# Patient Record
Sex: Male | Born: 1935 | Race: White | Hispanic: No | Marital: Married | Smoking: Never smoker
Health system: Southern US, Community
[De-identification: ages and names within clinical notes are randomized; demographics above are authoritative.]

## PROBLEM LIST (undated history)

## (undated) DIAGNOSIS — I2510 Atherosclerotic heart disease of native coronary artery without angina pectoris: Secondary | ICD-10-CM

## (undated) DIAGNOSIS — E119 Type 2 diabetes mellitus without complications: Secondary | ICD-10-CM

## (undated) DIAGNOSIS — I5020 Unspecified systolic (congestive) heart failure: Secondary | ICD-10-CM

## (undated) DIAGNOSIS — I10 Essential (primary) hypertension: Secondary | ICD-10-CM

---

## 2001-08-12 HISTORY — PX: VENTRICULOPERITONEAL SHUNT: SHX204

## 2007-08-13 HISTORY — PX: CORONARY ARTERY BYPASS GRAFT: SHX141

## 2013-02-22 ENCOUNTER — Encounter (HOSPITAL_COMMUNITY): Payer: Self-pay

## 2013-02-22 ENCOUNTER — Inpatient Hospital Stay (HOSPITAL_COMMUNITY): Payer: Medicare Other

## 2013-02-22 ENCOUNTER — Inpatient Hospital Stay (HOSPITAL_COMMUNITY)
Admission: EM | Admit: 2013-02-22 | Discharge: 2013-02-25 | DRG: 292 | Disposition: A | Discharge status: Home Health | Payer: Medicare Other | Attending: Cardiovascular Disease | Admitting: Cardiovascular Disease

## 2013-02-22 ENCOUNTER — Emergency Department (HOSPITAL_COMMUNITY): Payer: Medicare Other

## 2013-02-22 DIAGNOSIS — E1149 Type 2 diabetes mellitus with other diabetic neurological complication: Secondary | ICD-10-CM | POA: Diagnosis present

## 2013-02-22 DIAGNOSIS — E1169 Type 2 diabetes mellitus with other specified complication: Secondary | ICD-10-CM | POA: Diagnosis present

## 2013-02-22 DIAGNOSIS — Z9119 Patient's noncompliance with other medical treatment and regimen: Secondary | ICD-10-CM

## 2013-02-22 DIAGNOSIS — Z91199 Patient's noncompliance with other medical treatment and regimen due to unspecified reason: Secondary | ICD-10-CM

## 2013-02-22 DIAGNOSIS — E876 Hypokalemia: Secondary | ICD-10-CM | POA: Diagnosis present

## 2013-02-22 DIAGNOSIS — I10 Essential (primary) hypertension: Secondary | ICD-10-CM | POA: Diagnosis present

## 2013-02-22 DIAGNOSIS — L97409 Non-pressure chronic ulcer of unspecified heel and midfoot with unspecified severity: Secondary | ICD-10-CM | POA: Diagnosis present

## 2013-02-22 DIAGNOSIS — F329 Major depressive disorder, single episode, unspecified: Secondary | ICD-10-CM | POA: Diagnosis present

## 2013-02-22 DIAGNOSIS — F3289 Other specified depressive episodes: Secondary | ICD-10-CM | POA: Diagnosis present

## 2013-02-22 DIAGNOSIS — I4891 Unspecified atrial fibrillation: Secondary | ICD-10-CM | POA: Diagnosis present

## 2013-02-22 DIAGNOSIS — I498 Other specified cardiac arrhythmias: Secondary | ICD-10-CM | POA: Diagnosis present

## 2013-02-22 DIAGNOSIS — Z87891 Personal history of nicotine dependence: Secondary | ICD-10-CM

## 2013-02-22 DIAGNOSIS — I441 Atrioventricular block, second degree: Secondary | ICD-10-CM | POA: Diagnosis present

## 2013-02-22 DIAGNOSIS — I509 Heart failure, unspecified: Secondary | ICD-10-CM | POA: Diagnosis present

## 2013-02-22 DIAGNOSIS — Z951 Presence of aortocoronary bypass graft: Secondary | ICD-10-CM

## 2013-02-22 DIAGNOSIS — I4949 Other premature depolarization: Secondary | ICD-10-CM | POA: Diagnosis present

## 2013-02-22 DIAGNOSIS — Z95810 Presence of automatic (implantable) cardiac defibrillator: Secondary | ICD-10-CM

## 2013-02-22 DIAGNOSIS — E1142 Type 2 diabetes mellitus with diabetic polyneuropathy: Secondary | ICD-10-CM | POA: Diagnosis present

## 2013-02-22 DIAGNOSIS — I5023 Acute on chronic systolic (congestive) heart failure: Principal | ICD-10-CM | POA: Diagnosis present

## 2013-02-22 DIAGNOSIS — I428 Other cardiomyopathies: Secondary | ICD-10-CM | POA: Diagnosis present

## 2013-02-22 DIAGNOSIS — I2510 Atherosclerotic heart disease of native coronary artery without angina pectoris: Secondary | ICD-10-CM | POA: Diagnosis present

## 2013-02-22 HISTORY — DX: Type 2 diabetes mellitus without complications: E11.9

## 2013-02-22 HISTORY — DX: Atherosclerotic heart disease of native coronary artery without angina pectoris: I25.10

## 2013-02-22 HISTORY — DX: Essential (primary) hypertension: I10

## 2013-02-22 LAB — COMPREHENSIVE METABOLIC PANEL
Albumin: 3.6 g/dL (ref 3.5–5.2)
Alkaline Phosphatase: 73 U/L (ref 39–117)
BUN: 21 mg/dL (ref 6–23)
Creatinine, Ser: 1.2 mg/dL (ref 0.50–1.35)
GFR calc Af Amer: 65 mL/min — ABNORMAL LOW (ref >90)
GFR calc non Af Amer: 56 mL/min — ABNORMAL LOW (ref >90)
Potassium: 3.9 mEq/L (ref 3.5–5.1)
Total Bilirubin: 1.1 mg/dL (ref 0.3–1.2)
Total Protein: 7.3 g/dL (ref 6.0–8.3)

## 2013-02-22 LAB — CBC WITH DIFFERENTIAL/PLATELET
Basophils Relative: 0 % (ref 0–1)
Eosinophils Absolute: 0 10*3/uL (ref 0.0–0.7)
Hemoglobin: 16.2 g/dL (ref 13.0–17.0)
MCH: 30.5 pg (ref 26.0–34.0)
MCHC: 35.6 g/dL (ref 30.0–36.0)
Monocytes Absolute: 0.5 10*3/uL (ref 0.1–1.0)
Monocytes Relative: 5 % (ref 3–12)
Neutrophils Relative %: 86 % — ABNORMAL HIGH (ref 43–77)

## 2013-02-22 LAB — URINALYSIS, ROUTINE W REFLEX MICROSCOPIC
Bilirubin Urine: NEGATIVE
Glucose, UA: 100 mg/dL — AB
Ketones, ur: 15 mg/dL — AB
Leukocytes, UA: NEGATIVE
Protein, ur: >300 mg/dL — AB
pH: 6.5 (ref 5.0–8.0)

## 2013-02-22 LAB — URINE MICROSCOPIC-ADD ON

## 2013-02-22 MED ORDER — ASPIRIN EC 325 MG PO TBEC
325.0000 mg | DELAYED_RELEASE_TABLET | ORAL | Status: DC
  Administered 2013-02-22 – 2013-02-24 (×2): 325 mg via ORAL
  Filled 2013-02-22 (×4): qty 1

## 2013-02-22 MED ORDER — SODIUM CHLORIDE 0.9 % IV SOLN: 250.0000 mL | INTRAVENOUS | Status: DC | PRN

## 2013-02-22 MED ORDER — LISINOPRIL 20 MG PO TABS
20.0000 mg | ORAL_TABLET | Freq: Every day | ORAL | Status: DC
  Administered 2013-02-22 – 2013-02-24 (×3): 20 mg via ORAL
  Filled 2013-02-22 (×4): qty 1

## 2013-02-22 MED ORDER — ONDANSETRON HCL 4 MG/2ML IJ SOLN: 4.0000 mg | Freq: Four times a day (QID) | INTRAMUSCULAR | Status: DC | PRN

## 2013-02-22 MED ORDER — ADULT MULTIVITAMIN W/MINERALS CH
1.0000 | ORAL_TABLET | Freq: Every day | ORAL | Status: DC
  Administered 2013-02-22 – 2013-02-25 (×4): 1 via ORAL
  Filled 2013-02-22 (×4): qty 1

## 2013-02-22 MED ORDER — METFORMIN HCL 500 MG PO TABS
1000.0000 mg | ORAL_TABLET | Freq: Two times a day (BID) | ORAL | Status: DC
  Administered 2013-02-23 – 2013-02-24 (×3): 1000 mg via ORAL
  Filled 2013-02-22 (×5): qty 2

## 2013-02-22 MED ORDER — GLIPIZIDE 5 MG PO TABS
5.0000 mg | ORAL_TABLET | Freq: Two times a day (BID) | ORAL | Status: DC
  Administered 2013-02-23 – 2013-02-25 (×6): 5 mg via ORAL
  Filled 2013-02-22 (×7): qty 1

## 2013-02-22 MED ORDER — ACETAMINOPHEN 325 MG PO TABS: 650.0000 mg | ORAL_TABLET | ORAL | Status: DC | PRN

## 2013-02-22 MED ORDER — PAROXETINE HCL 10 MG PO TABS
10.0000 mg | ORAL_TABLET | Freq: Every day | ORAL | Status: DC
  Administered 2013-02-22 – 2013-02-24 (×3): 10 mg via ORAL
  Filled 2013-02-22 (×4): qty 1

## 2013-02-22 MED ORDER — SODIUM CHLORIDE 0.9 % IJ SOLN
3.0000 mL | Freq: Two times a day (BID) | INTRAMUSCULAR | Status: DC
  Administered 2013-02-22 – 2013-02-25 (×6): 3 mL via INTRAVENOUS

## 2013-02-22 MED ORDER — HEPARIN SODIUM (PORCINE) 5000 UNIT/ML IJ SOLN
5000.0000 [IU] | Freq: Three times a day (TID) | INTRAMUSCULAR | Status: DC
  Administered 2013-02-22 – 2013-02-25 (×7): 5000 [IU] via SUBCUTANEOUS
  Filled 2013-02-22 (×11): qty 1

## 2013-02-22 MED ORDER — POTASSIUM CHLORIDE CRYS ER 20 MEQ PO TBCR
20.0000 meq | EXTENDED_RELEASE_TABLET | Freq: Two times a day (BID) | ORAL | Status: DC
  Administered 2013-02-22: 20 meq via ORAL
  Filled 2013-02-22 (×3): qty 1

## 2013-02-22 MED ORDER — INSULIN ASPART 100 UNIT/ML ~~LOC~~ SOLN
0.0000 [IU] | Freq: Three times a day (TID) | SUBCUTANEOUS | Status: DC
  Administered 2013-02-23 (×2): 3 [IU] via SUBCUTANEOUS

## 2013-02-22 MED ORDER — FUROSEMIDE 10 MG/ML IJ SOLN
40.0000 mg | Freq: Two times a day (BID) | INTRAMUSCULAR | Status: DC
  Administered 2013-02-23 – 2013-02-24 (×3): 40 mg via INTRAVENOUS
  Filled 2013-02-22 (×5): qty 4

## 2013-02-22 MED ORDER — FUROSEMIDE 10 MG/ML IJ SOLN
80.0000 mg | Freq: Once | INTRAMUSCULAR | Status: AC
  Administered 2013-02-22: 80 mg via INTRAVENOUS
  Filled 2013-02-22: qty 8

## 2013-02-22 MED ORDER — SODIUM CHLORIDE 0.9 % IJ SOLN: 3.0000 mL | INTRAMUSCULAR | Status: DC | PRN

## 2013-02-22 NOTE — H&P (Signed)
Frederick Carr is an 79 y.o. male.   Chief Complaint: Weakness and leg edema HPI: 79 years old male who moved from [place], [place] 6 months ago, has been non-compliant on his medications and presents with leg edema and weakness. No chest pain, fever or cough. He has diabetic neuropathy and left leg weakness for some time.   Past Medical History  Diagnosis Date  . Diabetes mellitus without complication   . Hypertension   . Coronary artery disease   . CHF (congestive heart failure)     CABG-2009.   History reviewed. No pertinent past surgical history.  No family history on file. Social History:  reports that he has quit smoking. He does not have any smokeless tobacco history on file. He reports that he does not drink alcohol or use illicit drugs.  Allergies: No Known Allergies   (Not in a hospital admission)  Results for orders placed during the hospital encounter of 02/22/13 (from the past 48 hour(s))  CBC WITH DIFFERENTIAL     Status: Abnormal   Collection Time    02/22/13  3:34 PM      Result Value Range   WBC 9.8  4.0 - 10.5 K/uL   RBC 5.31  4.22 - 5.81 MIL/uL   Hemoglobin 16.2  13.0 - 17.0 g/dL   HCT 54.0  98.1 - 19.1 %   MCV 85.7  78.0 - 100.0 fL   MCH 30.5  26.0 - 34.0 pg   MCHC 35.6  30.0 - 36.0 g/dL   RDW 47.8  29.5 - 62.1 %   Platelets 176  150 - 400 K/uL   Neutrophils Relative % 86 (*) 43 - 77 %   Neutro Abs 8.4 (*) 1.7 - 7.7 K/uL   Lymphocytes Relative 9 (*) 12 - 46 %   Lymphs Abs 0.9  0.7 - 4.0 K/uL   Monocytes Relative 5  3 - 12 %   Monocytes Absolute 0.5  0.1 - 1.0 K/uL   Eosinophils Relative 0  0 - 5 %   Eosinophils Absolute 0.0  0.0 - 0.7 K/uL   Basophils Relative 0  0 - 1 %   Basophils Absolute 0.0  0.0 - 0.1 K/uL  COMPREHENSIVE METABOLIC PANEL     Status: Abnormal   Collection Time    02/22/13  3:34 PM      Result Value Range   Sodium 138  135 - 145 mEq/L   Potassium 3.9  3.5 - 5.1 mEq/L   Chloride 103  96 - 112 mEq/L   CO2 23  19 - 32 mEq/L   Glucose, Bld 176 (*) 70 - 99 mg/dL   BUN 21  6 - 23 mg/dL   Creatinine, Ser 3.08  0.50 - 1.35 mg/dL   Calcium 9.1  8.4 - 65.7 mg/dL   Total Protein 7.3  6.0 - 8.3 g/dL   Albumin 3.6  3.5 - 5.2 g/dL   AST 14  0 - 37 U/L   ALT 15  0 - 53 U/L   Alkaline Phosphatase 73  39 - 117 U/L   Total Bilirubin 1.1  0.3 - 1.2 mg/dL   GFR calc non Af Amer 56 (*) >90 mL/min   GFR calc Af Amer 65 (*) >90 mL/min   Comment:            The eGFR has been calculated     using the CKD EPI equation.     This calculation has not been  validated in all clinical     situations.     eGFR's persistently     <90 mL/min signify     possible Chronic Kidney Disease.  PRO B NATRIURETIC PEPTIDE     Status: Abnormal   Collection Time    02/22/13  3:35 PM      Result Value Range   Pro B Natriuretic peptide (BNP) 7023.0 (*) 0 - 450 pg/mL  TROPONIN I     Status: None   Collection Time    02/22/13  3:35 PM      Result Value Range   Troponin I <0.30  <0.30 ng/mL   Comment:            Due to the release kinetics of cTnI,     a negative result within the first hours     of the onset of symptoms does not rule out     myocardial infarction with certainty.     If myocardial infarction is still suspected,     repeat the test at appropriate intervals.  URINALYSIS, ROUTINE W REFLEX MICROSCOPIC     Status: Abnormal   Collection Time    02/22/13  4:08 PM      Result Value Range   Color, Urine YELLOW  YELLOW   APPearance CLEAR  CLEAR   Specific Gravity, Urine 1.020  1.005 - 1.030   pH 6.5  5.0 - 8.0   Glucose, UA 100 (*) NEGATIVE mg/dL   Hgb urine dipstick SMALL (*) NEGATIVE   Bilirubin Urine NEGATIVE  NEGATIVE   Ketones, ur 15 (*) NEGATIVE mg/dL   Protein, ur >161 (*) NEGATIVE mg/dL   Urobilinogen, UA 1.0  0.0 - 1.0 mg/dL   Nitrite NEGATIVE  NEGATIVE   Leukocytes, UA NEGATIVE  NEGATIVE  URINE MICROSCOPIC-ADD ON     Status: None   Collection Time    02/22/13  4:08 PM      Result Value Range   Squamous  Epithelial / LPF RARE  RARE   WBC, UA 0-2  <3 WBC/hpf   RBC / HPF 0-2  <3 RBC/hpf   Urine-Other MUCOUS PRESENT     Dg Chest 2 View  02/22/2013   *RADIOLOGY REPORT*  Clinical Data: Weakness.  History of cardiac bypass.  CHEST - 2 VIEW  Comparison: None.  Findings: Low volume chest.  Basilar atelectasis, greater on the right than left.  Left subclavian AICD.  Median sternotomy compatible with history of cardiac bypass.  No focal consolidation or pleural effusion is identified.  The cardiopericardial silhouette appears mildly enlarged however this may be secondary to the low inspiratory volumes.  IMPRESSION: Low volume chest.  Left subclavian AICD and postop changes of median sternotomy.  No definite acute cardiopulmonary disease.   Original Report Authenticated By: Andreas Newport, M.D.   Ct Head Wo Contrast  02/22/2013   *RADIOLOGY REPORT*  Clinical Data: Increased weakness  CT HEAD WITHOUT CONTRAST  Technique:  Contiguous axial images were obtained from the base of the skull through the vertex without contrast.  Comparison: None.  Findings: A VP shunt catheter enters the skull from a left frontal approach and traverses into the left lateral ventricle.  The tip is in the frontal horn. Ventricular system and extraaxial space are prominent compatible with diffuse atrophy.  There is no disproportionate dilatation of the ventricles.  There is no evidence of transependymal edema.  White matter atrophy is associated in the periventricular white matter.  There is no definitive evidence of  acute intracranial hemorrhage. The leptomeninges over the right temporal lobe is somewhat hyperdense however this is felt to be related to chronic change rather than acute hemorrhage.  Atherosclerotic calcifications of the internal carotid and middle cerebral artery are noted.  IMPRESSION: VP shunt catheter is in place with diffuse atrophy.  The ventricular system is prominent but there is no definite evidence of acute  hydrocephalus.  No evidence of acute intracranial pathology.  Chronic changes are noted.   Original Report Authenticated By: Jolaine Click, M.D.    @ROS @ ROS: [x]  Positive [ ]  Denies  General: [ ]  Weight loss, [ ]  Fever, [ ]  chills  Neurologic: [x ] Dizziness, [ ]  Blackouts, [ ]  Seizure  [ ]  Stroke, [ ]  "Mini stroke", [ ]  Slurred speech, [ ]  Temporary blindness; [x ] weakness in arms or legs, [ ]  Hoarseness  Cardiac: [x ] Chest pain/pressure, [ ]  Shortness of breath at rest [ ]  Shortness of breath with exertion, [ ]  Atrial fibrillation or irregular heartbeat  Vascular: [ ]  Pain in legs with walking, [ ]  Pain in legs at rest, [ ]  Pain in legs at night,  [ ]  Non-healing ulcer, [ ]  Blood clot in vein/DVT,  Pulmonary: [ ]  Home oxygen, [ ]  Productive cough, [ ]  Coughing up blood, [ ]  Asthma,  [ ]  Wheezing  Musculoskeletal: [ x] Arthritis, [x ] Low back pain, [ ]  Joint pain  Hematologic: [ ]  Easy Bruising, [ ]  Anemia; [ ]  Hepatitis  Gastrointestinal: [ ]  Blood in stool, [ ]  Gastroesophageal Reflux/heartburn, [ ]  Trouble swallowing  Urinary: [ ]  chronic Kidney disease, [ ]  on HD - [ ]  MWF or [ ]  TTHS, [ ]  Burning with urination, [ ]  Difficulty urinating  Skin: [ ]  Rashes, [x ] Wounds  Psychological: [ ]  Anxiety, [x ] Depression  Blood pressure 174/83, pulse 42, temperature 97.8 F (36.6 C), temperature source Oral, resp. rate 11, SpO2 95.00%.  HENT: Head: Normocephalic and atraumatic. Mouth/Throat: Oropharynx is clear and moist. Eyes: Hazel, conjunctivae and EOM are normal. Pupils are equal, round, and reactive to light.  Neck: Normal range of motion.  Cardiovascular: Normal rate, regular rhythm and normal heart sounds.  Pulmonary/Chest: Effort normal and breath sounds normal.  Abdominal: Soft. Bowel sounds are normal. There is mild epigastric tenderness. There is no guarding.  Neurological: She is alert and oriented to person, place, and time.  Skin: Skin is warm and dry.  Ext: 2 + edema. No  cyanosis or clubbing. Right heel diabetic ulcer. Psychiatric: He has a normal mood and flat affect.   Assessment/Plan Acute on chronic left heart systolic failure. Bradycardia with frequent premature ventricular beats DM, II with neuropathy Generalized weakness Depression CAD CABG Medications non-compliance  Admit/IV lasix./Right and left heart catheterization post diuresis/Echocardiogram  Millette Halberstam S 02/22/2013, 7:02 PM

## 2013-02-22 NOTE — ED Notes (Signed)
Pt. Woke up with increased weakness, became worse throughout the day.   Alert and oriented X4. STroke  Scale negative.   Denies any n/v Denies any chest pain or sob.  Denies any fevers.

## 2013-02-22 NOTE — ED Notes (Signed)
Dinner tray ordered.

## 2013-02-22 NOTE — ED Notes (Signed)
Cannot take vitals. Patient not in room

## 2013-02-22 NOTE — ED Provider Notes (Addendum)
History    CSN: [id number] Arrival date & time 02/22/13  1505  First MD Initiated Contact with Patient 02/22/13 1506     Chief Complaint  Patient presents with  . Weakness   (Consider location/radiation/quality/duration/timing/severity/associated sxs/prior Treatment) HPI Comments: Patient presents to the ER for evaluation of generalized weakness. Patient reports that he has been feeling very weak for some time, but in the last 24 hours symptoms have gotten much worse. Patient has not experienced any chest pain or shortness of breath. He denies headache and vision change. There has not been any cough, fever, nausea or vomiting. He does endorse intermittent diarrhea, mild. No abdominal pain associated. Patient has not had any urinary symptoms.  Patient reports that when he gets up and tries to walk, weakness significantly worsens. He gets very dizzy and has not dropped objects. He is unable to ambulate because of this.  Patient does report pain on the right heel. He is a diabetic, has been associating that with a diabetic neuropathy.  Patient is a 79 y.o. male presenting with weakness.  Weakness   No past medical history on file. No past surgical history on file. No family history on file. History  Substance Use Topics  . Smoking status: Not on file  . Smokeless tobacco: Not on file  . Alcohol Use: Not on file    Review of Systems  Gastrointestinal: Positive for diarrhea.  Neurological: Positive for weakness.  All other systems reviewed and are negative.    Allergies  Review of patient's allergies indicates not on file.  Home Medications  No current outpatient prescriptions on file. BP 137/85  Pulse 58  Temp(Src) 97.8 F (36.6 C) (Oral)  Resp 21  SpO2 94% Physical Exam  Constitutional: He is oriented to person, place, and time. He appears well-developed and well-nourished. No distress.  HENT:  Head: Normocephalic and atraumatic.  Right Ear: Hearing normal.  Left  Ear: Hearing normal.  Nose: Nose normal.  Mouth/Throat: Oropharynx is clear and moist and mucous membranes are normal.  Eyes: Conjunctivae and EOM are normal. Pupils are equal, round, and reactive to light.  Neck: Normal range of motion. Neck supple.  Cardiovascular: Regular rhythm, S1 normal and S2 normal.  Bradycardia present.  Exam reveals no gallop and no friction rub.   No murmur heard. Pulmonary/Chest: Effort normal. No respiratory distress. He has rales in the right lower field and the left lower field. He exhibits no tenderness.  Abdominal: Soft. Normal appearance and bowel sounds are normal. There is no hepatosplenomegaly. There is no tenderness. There is no rebound, no guarding, no tenderness at McBurney's point and negative Murphy's sign. No hernia.  Musculoskeletal: Normal range of motion. He exhibits edema.  Neurological: He is alert and oriented to person, place, and time. He has normal strength. No cranial nerve deficit or sensory deficit. Coordination normal. GCS eye subscore is 4. GCS verbal subscore is 5. GCS motor subscore is 6.  Skin: Skin is warm, dry and intact. No rash noted. No cyanosis.     Psychiatric: He has a normal mood and affect. His speech is normal and behavior is normal. Thought content normal.    ED Course  Procedures (including critical care time)  EKG:  Date: 02/22/2013  Rate: 57  Rhythm: sinus bradycardia  QRS Axis: normal  Intervals: PR prolonged  ST/T Wave abnormalities: ST elevations anteriorly  Conduction Disutrbances:nonspecific intraventricular conduction delay  Narrative Interpretation: st elevations associated with Q waves anteriorly  Old EKG Reviewed: none  available   Labs Reviewed  CBC WITH DIFFERENTIAL - Abnormal; Notable for the following:    Neutrophils Relative % 86 (*)    Neutro Abs 8.4 (*)    Lymphocytes Relative 9 (*)    All other components within normal limits  COMPREHENSIVE METABOLIC PANEL - Abnormal; Notable for the  following:    Glucose, Bld 176 (*)    GFR calc non Af Amer 56 (*)    GFR calc Af Amer 65 (*)    All other components within normal limits  PRO B NATRIURETIC PEPTIDE - Abnormal; Notable for the following:    Pro B Natriuretic peptide (BNP) 7023.0 (*)    All other components within normal limits  URINALYSIS, ROUTINE W REFLEX MICROSCOPIC - Abnormal; Notable for the following:    Glucose, UA 100 (*)    Hgb urine dipstick SMALL (*)    Ketones, ur 15 (*)    Protein, ur >300 (*)    All other components within normal limits  TROPONIN I  URINE MICROSCOPIC-ADD ON   Dg Chest 2 View  02/22/2013   *RADIOLOGY REPORT*  Clinical Data: Weakness.  History of cardiac bypass.  CHEST - 2 VIEW  Comparison: None.  Findings: Low volume chest.  Basilar atelectasis, greater on the right than left.  Left subclavian AICD.  Median sternotomy compatible with history of cardiac bypass.  No focal consolidation or pleural effusion is identified.  The cardiopericardial silhouette appears mildly enlarged however this may be secondary to the low inspiratory volumes.  IMPRESSION: Low volume chest.  Left subclavian AICD and postop changes of median sternotomy.  No definite acute cardiopulmonary disease.   Original Report Authenticated By: Andreas Newport, M.D.   Ct Head Wo Contrast  02/22/2013   *RADIOLOGY REPORT*  Clinical Data: Increased weakness  CT HEAD WITHOUT CONTRAST  Technique:  Contiguous axial images were obtained from the base of the skull through the vertex without contrast.  Comparison: None.  Findings: A VP shunt catheter enters the skull from a left frontal approach and traverses into the left lateral ventricle.  The tip is in the frontal horn. Ventricular system and extraaxial space are prominent compatible with diffuse atrophy.  There is no disproportionate dilatation of the ventricles.  There is no evidence of transependymal edema.  White matter atrophy is associated in the periventricular white matter.  There is no  definitive evidence of acute intracranial hemorrhage. The leptomeninges over the right temporal lobe is somewhat hyperdense however this is felt to be related to chronic change rather than acute hemorrhage.  Atherosclerotic calcifications of the internal carotid and middle cerebral artery are noted.  IMPRESSION: VP shunt catheter is in place with diffuse atrophy.  The ventricular system is prominent but there is no definite evidence of acute hydrocephalus.  No evidence of acute intracranial pathology.  Chronic changes are noted.   Original Report Authenticated By: Jolaine Click, M.D.   Diagnosis: 1. Congestive heart failure 2. Bradycardia 3. Generalized weakness  MDM  Patient presents to the ER for evaluation of generalized weakness. This has been unable to ambulate because of significant dizziness and weakness. He reports that he has been feeling badly for some time, but in the last 24 hours, symptoms have significantly worsened. Patient's wife now tells her that he has not taken the Lasix in 3 days because he does not want to keep getting up to go to the bathroom. Patient's oxygen saturations are somewhat diminished. The ER. He has not, however, in respiratory distress.  BNP is markedly elevated. I suspect decompensated congestive heart failure as a cause of some of the patient's symptoms.  Patient reports a previous history of atrial fibrillation. He is on sotalol. Patient is noted to be in sinus rhythm, but bradycardic. Additionally, patient having significant ectopy. Many of his PVCs are not being perfused, pulse rate is effectively in the 30s and 40s and his PVCs are not perfused. This is also likely adding to the patient's generalized weakness with exertion.  This patient decompensated congestive heart failure and bradycardia, his cardiologist will be consulted to evaluate and admit the patient.   Gilda Crease, MD 02/22/13 1746  Gilda Crease, MD 02/22/13 1747

## 2013-02-22 NOTE — ED Notes (Signed)
ADmitting MD at the bedside

## 2013-02-23 LAB — BASIC METABOLIC PANEL
BUN: 22 mg/dL (ref 6–23)
Calcium: 9.5 mg/dL (ref 8.4–10.5)
GFR calc Af Amer: 51 mL/min — ABNORMAL LOW (ref >90)
GFR calc non Af Amer: 44 mL/min — ABNORMAL LOW (ref >90)
Glucose, Bld: 125 mg/dL — ABNORMAL HIGH (ref 70–99)
Potassium: 3.3 mEq/L — ABNORMAL LOW (ref 3.5–5.1)
Sodium: 139 mEq/L (ref 135–145)

## 2013-02-23 LAB — GLUCOSE, CAPILLARY
Glucose-Capillary: 144 mg/dL — ABNORMAL HIGH (ref 70–99)
Glucose-Capillary: 96 mg/dL (ref 70–99)

## 2013-02-23 LAB — HEMOGLOBIN A1C: Mean Plasma Glucose: 134 mg/dL — ABNORMAL HIGH (ref <117)

## 2013-02-23 MED ORDER — POTASSIUM CHLORIDE CRYS ER 20 MEQ PO TBCR
40.0000 meq | EXTENDED_RELEASE_TABLET | Freq: Three times a day (TID) | ORAL | Status: DC
  Administered 2013-02-23 (×3): 40 meq via ORAL
  Filled 2013-02-23 (×5): qty 2

## 2013-02-23 MED ORDER — POTASSIUM CHLORIDE CRYS ER 20 MEQ PO TBCR: 40.0000 meq | EXTENDED_RELEASE_TABLET | Freq: Three times a day (TID) | ORAL | Status: DC

## 2013-02-23 MED ORDER — PNEUMOCOCCAL VAC POLYVALENT 25 MCG/0.5ML IJ INJ
0.5000 mL | INJECTION | INTRAMUSCULAR | Status: AC
  Filled 2013-02-23: qty 0.5

## 2013-02-23 NOTE — Progress Notes (Signed)
Nutrition Brief Note  Patient identified on the Malnutrition Screening Tool (MST) Report. Pt denies poor appetite. Per pt, usual weight is 215 lb. Suspect recent wt change likely fluid related.  RD provided "Low Sodium Nutrition Therapy" handout from the Academy of Nutrition and Dietetics. Reviewed patient's dietary recall. Provided examples on ways to decrease sodium intake in diet. Discouraged intake of processed foods and use of salt shaker. Encouraged fresh fruits and vegetables as well as whole grain sources of carbohydrates to maximize fiber intake.   RD discussed why it is important for patient to adhere to diet recommendations, and emphasized the role of fluids, foods to avoid, and importance of weighing self daily. Teach back method used.  Expect fair compliance. Noted pt stated that his wife is a Data processing manager and knows all of this information.  Body mass index is 32.12 kg/(m^2). Patient meets criteria for Obese Class I based on current BMI.   Current diet order is Carbohydrate Modified Medium Labs and medications reviewed.   No nutrition interventions warranted at this time. If nutrition issues arise, please consult RD.   Jarold Motto MS, RD, LDN Pager: [phone] After-hours pager: [phone]

## 2013-02-23 NOTE — Progress Notes (Signed)
Pt HR goes down to upper 30's, then goes back up to high 50's and low 60's. Pt asymptomatic when assessed no c/o chest pains nor SOB. Dr Algie Coffer notified . No new orders received. Continued to monitor patient closely

## 2013-02-23 NOTE — Progress Notes (Signed)
Subjective:  Feeling better. No chest pain. Decreasing leg edema. Poor LV systolic function by echocardiogram.  Objective:  Vital Signs in the last 24 hours: Temp:  [97.1 F (36.2 C)-98.3 F (36.8 C)] 97.1 F (36.2 C) (07/15 1807) Pulse Rate:  [57-72] 72 (07/15 1807) Cardiac Rhythm:  [-] Heart block;Sinus bradycardia (07/15 0830) Resp:  [18-26] 18 (07/15 1807) BP: (115-148)/(56-80) 123/63 mmHg (07/15 1807) SpO2:  [93 %-99 %] 99 % (07/15 1807) Weight:  [93.033 kg (205 lb 1.6 oz)] 93.033 kg (205 lb 1.6 oz) (07/14 2150)  Physical Exam: BP Readings from Last 1 Encounters:  02/23/13 123/63     Wt Readings from Last 1 Encounters:  02/22/13 93.033 kg (205 lb 1.6 oz)    Weight change:   HEENT: [place]/AT, Eyes- PERL, EOMI, Conjunctiva-Pink, Sclera-Non-icteric Neck: No JVD, No bruit, Trachea midline. Lungs:  Clearing, Bilateral. Cardiac:  Regular rhythm, normal S1 and S2, no S3.  Abdomen:  Soft, non-tender. Extremities:  1+ edema present. No cyanosis. No clubbing. Right heel ulcer. CNS: AxOx3, Cranial nerves grossly intact, moves all 4 extremities. Right handed. Skin: Warm and dry.   Intake/Output from previous day: 07/14 0701 - 07/15 0700 In: 360 [P.O.:360] Out: 1400 [Urine:1400]    Lab Results: BMET    Component Value Date/Time   NA 139 02/23/2013 0608   K 3.3* 02/23/2013 0608   CL 101 02/23/2013 0608   CO2 25 02/23/2013 0608   GLUCOSE 125* 02/23/2013 0608   BUN 22 02/23/2013 0608   CREATININE 1.47* 02/23/2013 0608   CALCIUM 9.5 02/23/2013 0608   GFRNONAA 44* 02/23/2013 0608   GFRAA 51* 02/23/2013 0608   CBC    Component Value Date/Time   WBC 9.8 02/22/2013 1534   RBC 5.31 02/22/2013 1534   HGB 16.2 02/22/2013 1534   HCT 45.5 02/22/2013 1534   PLT 176 02/22/2013 1534   MCV 85.7 02/22/2013 1534   MCH 30.5 02/22/2013 1534   MCHC 35.6 02/22/2013 1534   RDW 13.9 02/22/2013 1534   LYMPHSABS 0.9 02/22/2013 1534   MONOABS 0.5 02/22/2013 1534   EOSABS 0.0 02/22/2013 1534   BASOSABS 0.0  02/22/2013 1534   CARDIAC ENZYMES Lab Results  Component Value Date   TROPONINI <0.30 02/22/2013    Scheduled Meds: . aspirin EC  325 mg Oral 3 times weekly  . furosemide  40 mg Intravenous Q12H  . glipiZIDE  5 mg Oral BID AC  . heparin  5,000 Units Subcutaneous Q8H  . insulin aspart  0-20 Units Subcutaneous TID WC  . lisinopril  20 mg Oral QHS  . metFORMIN  1,000 mg Oral BID WC  . multivitamin with minerals  1 tablet Oral Daily  . PARoxetine  10 mg Oral QHS  . pneumococcal 23 valent vaccine  0.5 mL Intramuscular Tomorrow-1000  . potassium chloride  40 mEq Oral TID  . sodium chloride  3 mL Intravenous Q12H   Continuous Infusions:  PRN Meds:.sodium chloride, acetaminophen, ondansetron (ZOFRAN) IV, sodium chloride  Assessment/Plan: Acute on chronic left heart systolic failure.  Bradycardia with frequent premature ventricular beats  DM, II with neuropathy  Generalized weakness  Depression  CAD  CABG  Medications non-compliance  Continue diuresis. PT/OT consult   LOS: 1 day    Orpah Cobb  MD  02/23/2013, 7:50 PM

## 2013-02-23 NOTE — Progress Notes (Signed)
Utilization Review Completed Dyneisha Murchison J. Domnique Vanegas, RN, BSN, NCM [phone]  

## 2013-02-23 NOTE — Care Management Note (Addendum)
    Page 1 of 2   03/01/2013     4:24:35 PM   CARE MANAGEMENT NOTE 03/01/2013  Patient:  Hillmer,Jafar   Account Number:  [id number]  Date Initiated:  02/23/2013  Documentation initiated by:  Oletta Cohn  Subjective/Objective Assessment:   79 yo male admitted with weakness, leg edema and CHF// home with spouse (moved from [place], [place] 71mo ago)     Action/Plan:   IV lasix.;Right and left heart catheterization post diuresis;Echocardiogram//home with [facility]   Anticipated DC Date:  02/26/2013   Anticipated DC Plan:  HOME W HOME HEALTH SERVICES      DC Planning Services  CM consult      [facility] Choice  HOME HEALTH   Choice offered to / List presented to:  C-1 Patient        HH arranged  HH-1 RN  HH-10 DISEASE MANAGEMENT  HH-2 PT      HH agency  [facility]   Status of service:  Completed, signed off Medicare Important Message given?   (If response is "NO", the following Medicare IM given date fields will be blank) Date Medicare IM given:   Date Additional Medicare IM given:    Discharge Disposition:    Per UR Regulation:  Reviewed for med. necessity/level of care/duration of stay  If discussed at Long Length of Stay Meetings, dates discussed:    Comments:  02/25/13 @ 1345.Marland KitchenMarland KitchenOletta Cohn, RN, BSN, [place] [phone] Spoke to pt at bedside regarding Home Health discharge planning for disease mangement of CHF and Physical Therapy. Offered pt list of Home Health agencies.  Pt chose [facility] to render services.  Tommas Olp of [place] notified.  Pt uses a cane at home- no other DME needs identified at this time.

## 2013-02-23 NOTE — Progress Notes (Signed)
  Echocardiogram 2D Echocardiogram has been performed.  Jorje Guild 02/23/2013, 9:49 AM

## 2013-02-23 NOTE — Progress Notes (Signed)
Admitted pt from ED. AAox3. Assisted to bed. Oriented to call bell and room. 02 put on pt. VS taken and recorded. SR up x3. Pt incontinent of urine . Condom cath applied by tech. Telemetry =1st degree block. No c/o chest pains nor SOB. Instructed to call for assistance.Initial meds given as ordered. Blood glucose obtained. Continued to monitor patient

## 2013-02-24 LAB — GLUCOSE, CAPILLARY
Glucose-Capillary: 89 mg/dL (ref 70–99)
Glucose-Capillary: 82 mg/dL (ref 70–99)

## 2013-02-24 LAB — BASIC METABOLIC PANEL
Calcium: 9.6 mg/dL (ref 8.4–10.5)
GFR calc Af Amer: 44 mL/min — ABNORMAL LOW (ref >90)
GFR calc non Af Amer: 38 mL/min — ABNORMAL LOW (ref >90)
Potassium: 4.6 mEq/L (ref 3.5–5.1)
Sodium: 139 mEq/L (ref 135–145)

## 2013-02-24 MED ORDER — POTASSIUM CHLORIDE CRYS ER 10 MEQ PO TBCR
10.0000 meq | EXTENDED_RELEASE_TABLET | Freq: Every day | ORAL | Status: DC
  Administered 2013-02-24 – 2013-02-25 (×2): 10 meq via ORAL
  Filled 2013-02-24 (×2): qty 1

## 2013-02-24 MED ORDER — FUROSEMIDE 40 MG PO TABS
40.0000 mg | ORAL_TABLET | Freq: Every day | ORAL | Status: DC
  Administered 2013-02-25: 40 mg via ORAL
  Filled 2013-02-24: qty 1

## 2013-02-24 NOTE — Evaluation (Signed)
Physical Therapy Evaluation Patient Details Name: Frederick Carr MRN: [id number] DOB: 1932/03/16 Today's Date: 02/24/2013 Time: 7829-5621 PT Time Calculation (min): 17 min  PT Assessment / Plan / Recommendation History of Present Illness     Clinical Impression  Pt is a 79 years old male who moved from [place], [place] 6 months ago, has been non-compliant on his medications and presents with leg edema and weakness, diagnosed with CHF and hx of diabetic neuropathy and left leg weakness for some time.  Pt currently with functional limitations due to the deficits listed below (see PT Problem List).  Pt will benefit from skilled PT to increase their independence and safety with mobility to allow discharge to the venue listed below.  Pt reports hx of poor balance and agreeable to use RW at home upon d/c. Recommend RW upon d/c.     PT Assessment  Patient needs continued PT services    Follow Up Recommendations  Home health PT;Supervision for mobility/OOB    Does the patient have the potential to tolerate intense rehabilitation      Barriers to Discharge        Equipment Recommendations  Rolling walker with 5" wheels    Recommendations for Other Services     Frequency Min 3X/week    Precautions / Restrictions Precautions Precautions: Fall   Pertinent Vitals/Pain Post-gait: SaO2 97% and HR 62 bpm      Mobility  Bed Mobility Bed Mobility: Not assessed Details for Bed Mobility Assistance: up in recliner on arrival Transfers Transfers: Sit to Stand;Stand to Sit Sit to Stand: 5: Supervision;From chair/3-in-1;With upper extremity assist Stand to Sit: 5: Supervision;To chair/3-in-1;With upper extremity assist Details for Transfer Assistance: verbal cues for safety  Ambulation/Gait Ambulation/Gait Assistance: 4: Min guard Ambulation Distance (Feet): 280 Feet Assistive device: Rolling walker Ambulation/Gait Assistance Details: ambulated to doorway with 1 hha and using  sink/chair/surfaces so given RW to steady with hallway ambulation, pt reports feeling very weak Gait Pattern: Step-through pattern;Decreased stride length Gait velocity: decreased General Gait Details: antalgic gait observed however pt reports no pain, R foot appears smaller than L     Exercises     PT Diagnosis: Difficulty walking;Generalized weakness  PT Problem List: Decreased strength;Decreased mobility;Decreased balance;Decreased knowledge of use of DME PT Treatment Interventions: DME instruction;Gait training;Therapeutic activities;Therapeutic exercise;Patient/family education;Functional mobility training;Balance training;Neuromuscular re-education     PT Goals(Current goals can be found in the care plan section) Acute Rehab PT Goals Patient Stated Goal: home tomorrow PT Goal Formulation: With patient Time For Goal Achievement: 03/03/13 Potential to Achieve Goals: Good  Visit Information  Last PT Received On: 02/24/13 Assistance Needed: +1       Prior Functioning  Home Living Family/patient expects to be discharged to:: Private residence Living Arrangements: Spouse/significant other Type of Home: Apartment Home Equipment: Cane - single point Additional Comments: Pt lives in apt with spouse, reports handicap accessible Prior Function Level of Independence: Independent with assistive device(s) Communication Communication: No difficulties    Cognition  Cognition Arousal/Alertness: Awake/alert Behavior During Therapy: WFL for tasks assessed/performed Overall Cognitive Status: No family/caregiver present to determine baseline cognitive functioning (appears a little confused this morning)    Extremity/Trunk Assessment Upper Extremity Assessment Upper Extremity Assessment: Defer to OT evaluation Lower Extremity Assessment Lower Extremity Assessment: Generalized weakness   Balance    End of Session PT - End of Session Equipment Utilized During Treatment: Gait  belt Activity Tolerance: Patient tolerated treatment well Patient left: in chair;with call bell/phone within reach  GP     Endy Easterly,KATHrine E 02/24/2013, 9:24 AM Zenovia Jarred, PT, DPT 02/24/2013 Pager: [phone]

## 2013-02-24 NOTE — Progress Notes (Signed)
Pt requesting to have his ICD checked

## 2013-02-24 NOTE — Progress Notes (Signed)
Subjective:  Feeling better. Increasing BUN/Cr.  Objective:  Vital Signs in the last 24 hours: Temp:  [97.1 F (36.2 C)-98.3 F (36.8 C)] 98.1 F (36.7 C) (07/16 0616) Pulse Rate:  [58-72] 72 (07/16 0616) Cardiac Rhythm:  [-] Heart block;Sinus bradycardia (07/15 2024) Resp:  [18-20] 20 (07/16 0616) BP: (115-144)/(56-74) 144/74 mmHg (07/16 0616) SpO2:  [96 %-99 %] 96 % (07/16 0616) Weight:  [93.8 kg (206 lb 12.7 oz)] 93.8 kg (206 lb 12.7 oz) (07/16 0616)  Physical Exam: BP Readings from Last 1 Encounters:  02/24/13 144/74     Wt Readings from Last 1 Encounters:  02/24/13 93.8 kg (206 lb 12.7 oz)    Weight change: 0.767 kg (1 lb 11.1 oz)  HEENT: [place]/AT, Eyes-Hazel, PERL, EOMI, Conjunctiva-Pink, Sclera-Non-icteric Neck: No JVD, No bruit, Trachea midline. Lungs:  Clear, Bilateral. Cardiac:  Regular rhythm, normal S1 and S2, no S3.  Abdomen:  Soft, non-tender. Extremities:  Trace ankle edema present. No cyanosis. No clubbing. CNS: AxOx3, Cranial nerves grossly intact, moves all 4 extremities. Right handed. Skin: Warm and dry.   Intake/Output from previous day: 07/15 0701 - 07/16 0700 In: 603 [P.O.:600; I.V.:3] Out: 3575 [Urine:3575]    Lab Results: BMET    Component Value Date/Time   NA 139 02/24/2013 0545   K 4.6 02/24/2013 0545   CL 102 02/24/2013 0545   CO2 29 02/24/2013 0545   GLUCOSE 67* 02/24/2013 0545   BUN 28* 02/24/2013 0545   CREATININE 1.67* 02/24/2013 0545   CALCIUM 9.6 02/24/2013 0545   GFRNONAA 38* 02/24/2013 0545   GFRAA 44* 02/24/2013 0545   CBC    Component Value Date/Time   WBC 9.8 02/22/2013 1534   RBC 5.31 02/22/2013 1534   HGB 16.2 02/22/2013 1534   HCT 45.5 02/22/2013 1534   PLT 176 02/22/2013 1534   MCV 85.7 02/22/2013 1534   MCH 30.5 02/22/2013 1534   MCHC 35.6 02/22/2013 1534   RDW 13.9 02/22/2013 1534   LYMPHSABS 0.9 02/22/2013 1534   MONOABS 0.5 02/22/2013 1534   EOSABS 0.0 02/22/2013 1534   BASOSABS 0.0 02/22/2013 1534   CARDIAC ENZYMES Lab  Results  Component Value Date   TROPONINI <0.30 02/22/2013    Scheduled Meds: . aspirin EC  325 mg Oral 3 times weekly  . [START ON 02/25/2013] furosemide  40 mg Oral Daily  . glipiZIDE  5 mg Oral BID AC  . heparin  5,000 Units Subcutaneous Q8H  . insulin aspart  0-20 Units Subcutaneous TID WC  . lisinopril  20 mg Oral QHS  . metFORMIN  1,000 mg Oral BID WC  . multivitamin with minerals  1 tablet Oral Daily  . PARoxetine  10 mg Oral QHS  . pneumococcal 23 valent vaccine  0.5 mL Intramuscular Tomorrow-1000  . potassium chloride  10 mEq Oral Daily  . sodium chloride  3 mL Intravenous Q12H   Continuous Infusions:  PRN Meds:.sodium chloride, acetaminophen, ondansetron (ZOFRAN) IV, sodium chloride  Assessment/Plan: Acute on chronic left heart systolic failure.  Bradycardia with frequent premature ventricular beats  DM, II with neuropathy  Generalized weakness  Depression  CAD  CABG  Medications non-compliance Hypokalemia-resolved  Increase activity.  Hold Lasix today then change to PO daily.   LOS: 2 days    Orpah Cobb  MD  02/24/2013, 8:52 AM

## 2013-02-24 NOTE — Progress Notes (Signed)
Pt HR brady down to 39. Pt asym was sleeping at the time. Will continue to monitor

## 2013-02-24 NOTE — Plan of Care (Signed)
Problem: Phase I Progression Outcomes Goal: EF % per last Echo/documented,Core Reminder form on chart Outcome: Completed/Met Date Met:  02/24/13 20-25%

## 2013-02-24 NOTE — Progress Notes (Signed)
PHARMACIST - PHYSICIAN COMMUNICATION DR:  Algie Coffer CONCERNING:  METFORMIN SAFE ADMINISTRATION POLICY  RECOMMENDATION: Metformin has been placed on DISCONTINUE (rejected order) STATUS and should be reordered only after any of the conditions below are ruled out.  Current safety recommendations include avoiding metformin for a minimum of 48 hours after the patient's exposure to intravenous contrast media.  DESCRIPTION:  The Pharmacy Committee has adopted a policy that restricts the use of metformin in hospitalized patients until all the contraindications to administration have been ruled out. Specific contraindications are: [x]  Serum creatinine ? 1.5 for males []  Serum creatinine ? 1.4 for females []  Shock, acute MI, sepsis, hypoxemia, dehydration []  Planned administration of intravenous iodinated contrast media []  Heart Failure patients with low EF []  Acute or chronic metabolic acidosis (including DKA)

## 2013-02-24 NOTE — Evaluation (Signed)
Occupational Therapy Evaluation Patient Details Name: Frederick Carr MRN: [id number] DOB: April 15, 1932 Today's Date: 02/24/2013 Time: 4098-1191 OT Time Calculation (min): 26 min  OT Assessment / Plan / Recommendation History of present illness 79 years old male who moved from [place], [place] 6 months ago, has been non-compliant on his medications and presents with leg edema and weakness. No chest pain, fever or cough. He has diabetic neuropathy and left leg weakness for some time.   Clinical Impression   Pt admitted with edema and weakness. Pt currently with functional limitations due to the deficits listed below (see OT Problem List). Pt do discharge home with spouse.  He reports he is at baseline, but requires min guard assist Pt will benefit from skilled OT to increase their safety and independence with ADL and functional mobility for ADL to facilitate discharge to venue listed below.      OT Assessment  Patient needs continued OT Services    Follow Up Recommendations  No OT follow up;Supervision/Assistance - 24 hour    Barriers to Discharge      Equipment Recommendations  None recommended by OT    Recommendations for Other Services    Frequency  Min 2X/week    Precautions / Restrictions Precautions Precautions: Fall   Pertinent Vitals/Pain     ADL  Eating/Feeding: Independent Where Assessed - Eating/Feeding: Chair Grooming: Wash/dry hands;Wash/dry face;Teeth care;Brushing hair;Min guard Where Assessed - Grooming: Supported standing Upper Body Bathing: Set up Where Assessed - Upper Body Bathing: Unsupported sitting Lower Body Bathing: Min guard Where Assessed - Lower Body Bathing: Supported sit to stand Upper Body Dressing: Set up Where Assessed - Upper Body Dressing: Unsupported sitting Lower Body Dressing: Min guard Where Assessed - Lower Body Dressing: Supported sit to Pharmacist, hospital: Hydrographic surveyor Method: Sit to stand;Stand pivot Education administrator: Comfort height toilet Toileting - Architect and Hygiene: Min guard Where Assessed - Engineer, mining and Hygiene: Standing Equipment Used: Rolling walker Transfers/Ambulation Related to ADLs: min guard assist ADL Comments: Pt reports he feels he is at, or better than baseline.  Discussed energy conservation techniques    OT Diagnosis: Generalized weakness  OT Problem List: Decreased strength;Decreased activity tolerance;Decreased knowledge of use of DME or AE OT Treatment Interventions: Self-care/ADL training;DME and/or AE instruction;Therapeutic activities;Patient/family education   OT Goals(Current goals can be found in the care plan section) Acute Rehab OT Goals Patient Stated Goal: home tomorrow OT Goal Formulation: With patient Time For Goal Achievement: 03/03/13 Potential to Achieve Goals: Good ADL Goals Pt Will Perform Grooming: with modified independence;standing Pt Will Perform Upper Body Bathing: with modified independence;sitting Pt Will Perform Lower Body Bathing: with modified independence;sit to/from stand Pt Will Perform Upper Body Dressing: with modified independence;sitting Pt Will Perform Lower Body Dressing: with modified independence;sit to/from stand Pt Will Transfer to Toilet: with modified independence;ambulating;regular height toilet;grab bars Pt Will Perform Toileting - Clothing Manipulation and hygiene: with modified independence;sit to/from stand Pt Will Perform Tub/Shower Transfer: Tub transfer;with modified independence;ambulating;grab bars Additional ADL Goal #1: Pt will be independent with energy conservation techniques  Visit Information  Last OT Received On: 02/24/13 Assistance Needed: +1 History of Present Illness: 79 years old male who moved from [place], [place] 6 months ago, has been non-compliant on his medications and presents with leg edema and weakness. No chest pain, fever or cough. He has diabetic neuropathy  and left leg weakness for some time.       Prior Functioning  Home Living Family/patient expects to be discharged to:: Private residence Living Arrangements: Spouse/significant other Available Help at Discharge: Family;Available 24 hours/day Type of Home: Apartment Home Access: Level entry Home Layout: One level Home Equipment: Cane - single point;Grab bars - toilet;Grab bars - tub/shower;Adaptive equipment Adaptive Equipment: Reacher;Sock aid Additional Comments: Pt lives in apt with spouse, reports handicap accessible Prior Function Level of Independence: Independent with assistive device(s) Communication Communication: No difficulties Dominant Hand: Right         Vision/Perception     Cognition  Cognition Arousal/Alertness: Awake/alert Behavior During Therapy: WFL for tasks assessed/performed Overall Cognitive Status: No family/caregiver present to determine baseline cognitive functioning (Pt with h/o MI and subsequent CIR)    Extremity/Trunk Assessment Upper Extremity Assessment Upper Extremity Assessment: Overall WFL for tasks assessed     Mobility Bed Mobility Bed Mobility: Not assessed Transfers Transfers: Sit to Stand;Stand to Sit Sit to Stand: 5: Supervision;From chair/3-in-1;With upper extremity assist Stand to Sit: 5: Supervision;To chair/3-in-1;With upper extremity assist Details for Transfer Assistance: verbal cues for safety      Exercise     Balance     End of Session OT - End of Session Equipment Utilized During Treatment: Rolling walker Activity Tolerance: Patient tolerated treatment well Patient left: in chair;with call bell/phone within reach  GO     Lilyanne Mcquown M 02/24/2013, 4:32 PM

## 2013-02-24 NOTE — Progress Notes (Signed)
Pt in chair eat breakfast, request RN do asscessment after breakfast .

## 2013-02-25 LAB — GLUCOSE, CAPILLARY
Glucose-Capillary: 63 mg/dL — ABNORMAL LOW (ref 70–99)
Glucose-Capillary: 110 mg/dL — ABNORMAL HIGH (ref 70–99)

## 2013-02-25 LAB — BASIC METABOLIC PANEL
BUN: 34 mg/dL — ABNORMAL HIGH (ref 6–23)
Chloride: 99 mEq/L (ref 96–112)
GFR calc Af Amer: 46 mL/min — ABNORMAL LOW (ref >90)
Glucose, Bld: 64 mg/dL — ABNORMAL LOW (ref 70–99)
Potassium: 4.1 mEq/L (ref 3.5–5.1)
Sodium: 136 mEq/L (ref 135–145)

## 2013-02-25 MED ORDER — FUROSEMIDE 40 MG PO TABS: 40.0000 mg | ORAL_TABLET | Freq: Every day | ORAL | Status: DC

## 2013-02-25 MED ORDER — ISOSORB DINITRATE-HYDRALAZINE 20-37.5 MG PO TABS
1.0000 | ORAL_TABLET | Freq: Two times a day (BID) | ORAL | Status: DC
  Administered 2013-02-25: 1 via ORAL
  Filled 2013-02-25 (×2): qty 1

## 2013-02-25 MED ORDER — CARVEDILOL 3.125 MG PO TABS: 3.1250 mg | ORAL_TABLET | Freq: Two times a day (BID) | ORAL | Status: AC

## 2013-02-25 MED ORDER — ISOSORB DINITRATE-HYDRALAZINE 20-37.5 MG PO TABS: 1.0000 | ORAL_TABLET | Freq: Two times a day (BID) | ORAL | Status: DC

## 2013-02-25 NOTE — Progress Notes (Signed)
Inpatient Diabetes Program Recommendations  AACE/ADA: New Consensus Statement on Inpatient Glycemic Control (2013)  Target Ranges:  Prepandial:   less than 140 mg/dL      Peak postprandial:   less than 180 mg/dL (1-2 hours)      Critically ill patients:  140 - 180 mg/dL    Results for DELFINO, FRIESEN (MRN [id number]) as of 02/25/2013 14:04  Ref. Range 02/25/2013 06:27 02/25/2013 06:52 02/25/2013 11:34  Glucose-Capillary Latest Range: 70-99 mg/dL 63 (L) 84 409 (H)    Hypoglycemic this morning.  Inpatient Diabetes Program Recommendations Oral Agents: Please stop the ordered Glipizide 5 mg bid until patient ready for discharge.   Will follow. Ambrose Finland RN, MSN, CDE Diabetes Coordinator Inpatient Diabetes Program [phone]

## 2013-02-25 NOTE — Progress Notes (Signed)
1500 seen and evaluated by Dr. Algie Coffer . Reviewed EKG strips. With orders

## 2013-02-25 NOTE — Progress Notes (Signed)
Pt's BG=63 at 06:27, pt is asymptomatic, gave i cup of orange juice and rechecked BG=84. Will continue to monitor.

## 2013-02-25 NOTE — Progress Notes (Signed)
1930 discharge instructions and prescriptions given to pt  Verbalized understanding Wheeled to lobby by NT with family member

## 2013-02-25 NOTE — Progress Notes (Signed)
Occupational Therapy Treatment and Discharge Patient Details Name: Frederick Carr MRN: [id number] DOB: [birth year] Today's Date: 02/25/2013 Time: 7829-5621 OT Time Calculation (min): 15 min  OT Assessment / Plan / Recommendation  OT comments  Pt met some of the goals and based on him meeting these goals (UBB and toilet transfer) I do not foresee any issues with his other BADL goals. He is at a I/Mod I level and will have A/S at home intermittently to 24 hours. Acute OT will sign off.  Follow Up Recommendations  No OT follow up;Supervision - Intermittent       Equipment Recommendations  None recommended by OT       Frequency Min 2X/week   Progress towards OT Goals Progress towards OT goals: Goals met/education completed, patient discharged from OT  Plan Discharge plan needs to be updated    Precautions / Restrictions Precautions Precautions: Fall (He walks stiffly and decreased neck rotation) Restrictions Weight Bearing Restrictions: No       ADL  Upper Body Dressing: Independent Where Assessed - Upper Body Dressing: Unsupported sit to stand Toilet Transfer: Modified independent Toilet Transfer Method: Sit to Barista:  (recliner> out door/down hall> back to recliner) Equipment Used: Rolling walker Transfers/Ambulation Related to ADLs: Mod I for all with RW and without RW ADL Comments: Energy conservation handout given to him for his review--pt reports he does not feel any more tired than usual and no DOE noted      OT Goals(current goals can now be found in the care plan section)    Visit Information  Last OT Received On: 02/25/13 Assistance Needed: +1 History of Present Illness: 80 years old male who moved from [place], [place] 6 months ago, has been non-compliant on his medications and presents with leg edema and weakness. No chest pain, fever or cough. He has diabetic neuropathy and left leg weakness for some time.          Cognition  Cognition Arousal/Alertness: Awake/alert Behavior During Therapy: WFL for tasks assessed/performed Overall Cognitive Status: Within Functional Limits for tasks assessed    Mobility  Bed Mobility Details for Bed Mobility Assistance: up in recliner on arrival Transfers Transfers: Sit to Stand;Stand to Sit Sit to Stand: 6: Modified independent (Device/Increase time);With upper extremity assist;With armrests;From chair/3-in-1 Stand to Sit: 6: Modified independent (Device/Increase time);With upper extremity assist;With armrests;To chair/3-in-1          End of Session OT - End of Session Equipment Utilized During Treatment: Rolling walker Activity Tolerance: Patient tolerated treatment well Patient left: in chair;with call bell/phone within reach Nurse Communication:  (nurse and NT saw him ambulating in hall with me no AD)       Evette Georges [phone] 02/25/2013, 10:25 AM

## 2013-02-25 NOTE — Progress Notes (Signed)
1600 Pacemaker interrogated by pacemaker , medronic company at bedside. She spoke wit Dr. Algie Coffer thereafter

## 2013-02-25 NOTE — Discharge Summary (Signed)
Physician Discharge Summary  Patient ID: Frederick Carr MRN: [id number] DOB/AGE: [birth year] 79 y.o.  Admit date: 02/22/2013 Discharge date: 02/25/2013  Admission Diagnoses: Acute on chronic left heart systolic failure.  Bradycardia with frequent premature ventricular beats  DM, II with neuropathy  Generalized weakness  Depression  CAD  CABG  Medications non-compliance    Discharge Diagnoses:  Principle Problem: * Acute on chronic left heart systolic failure*.  Intermittent 2nd degree heart block S/P ICD  Dilated cardiomyopathy DM, II with neuropathy  Generalized weakness  Depression  CAD  CABG  Medications non-compliance  Hypokalemia-resolved   Discharged Condition: fair  Hospital Course: 79 years old male who moved from [place], [place] 6 months ago, has been non-compliant on his medications and presented with leg edema and weakness. He had good diuresis. His sotalol was held. Bidil and Coreg were added. His ICD was interrogated and his Ventricular rate was increased to 50/min. He was advised to use PT, OT and OP nursing to assist him till he regained some strength.   Consults: None  Significant Diagnostic Studies: labs: Normal CBC and near normal BMET. Mild renal insufficiency and hypokalemia post diuresis.  Treatments: cardiac meds: lisinopril (Prinivil), carvedilol and furosemide  Discharge Exam: Blood pressure 117/78, pulse 61, temperature 97.3 F (36.3 C), temperature source Oral, resp. rate 17, height 5\' 7"  (1.702 m), weight 92.171 kg (203 lb 3.2 oz), SpO2 97.00%. HEENT: [place]/AT, Eyes-Hazel, PERL, EOMI, Conjunctiva-Pink, Sclera-Non-icteric  Neck: No JVD, No bruit, Trachea midline.  Lungs: Clear, Bilateral.  Cardiac: Regular rhythm, normal S1 and S2, no S3.  Abdomen: Soft, non-tender.  Extremities: Trace ankle edema present. No cyanosis. No clubbing.  CNS: AxOx3, Cranial nerves grossly intact, moves all 4 extremities. Right handed.  Skin: Warm and  dry.   Disposition: 01, home or self care.   Future Appointments Provider Department Dept Phone   03/18/2013 1:15 PM Gordy Savers, MD [facility] [place] [phone]       Medication List    STOP taking these medications       sotalol 80 MG tablet  Commonly known as:  BETAPACE      TAKE these medications       aspirin EC 325 MG tablet  Take 325 mg by mouth 3 (three) times a week.     carvedilol 3.125 MG tablet  Commonly known as:  COREG  Take 1 tablet (3.125 mg total) by mouth 2 (two) times daily with a meal.     furosemide 40 MG tablet  Commonly known as:  LASIX  Take 1 tablet (40 mg total) by mouth daily.     glipiZIDE 10 MG tablet  Commonly known as:  GLUCOTROL  Take 5 mg by mouth 2 (two) times daily before a meal.     isosorbide-hydrALAZINE 20-37.5 MG per tablet  Commonly known as:  BIDIL  Take 1 tablet by mouth 2 (two) times daily.     lisinopril 20 MG tablet  Commonly known as:  PRINIVIL,ZESTRIL  Take 20 mg by mouth at bedtime.     metFORMIN 500 MG tablet  Commonly known as:  GLUCOPHAGE  Take 1,000 mg by mouth 2 (two) times daily with a meal.     multivitamin with minerals Tabs  Take 1 tablet by mouth daily.     PARoxetine 10 MG tablet  Commonly known as:  PAXIL  Take 10 mg by mouth at bedtime.     potassium chloride SA 20 MEQ tablet  Commonly known as:  K-DUR,KLOR-CON  Take 20 mEq by mouth 3 (three) times a week.           Follow-up Information   Follow up with [facility] S, MD. Schedule an appointment as soon as possible for a visit in 1 month.   Contact information:   [address] [place] [address] [phone]       Signed: Ricki Rodriguez 02/25/2013, 5:52 PM

## 2013-02-25 NOTE — Progress Notes (Signed)
CRITICAL VALUE ALERT  Critical value received: BG=63  Date of notification:  02/25/13  Time of notification:  0625  Critical value read back:yes  Nurse who received alert:  G. Ehsan Corvin  MD notified (1st page):  Protocol followed  Time of first page:    MD notified (2nd page):  Time of second page:  Responding MD:    Time MD responded:

## 2013-02-25 NOTE — Progress Notes (Signed)
1235 Pt running brady 30's -33 's  with v paced beats Asymptomatic > DR Algie Coffer  Made aware will the pt in .15 mins

## 2013-03-02 NOTE — Progress Notes (Signed)
03/02/13..I spoke with the patient about not receiving walker prior to discharge home.  [facility] will deliver walker to pt.

## 2013-03-18 ENCOUNTER — Ambulatory Visit (INDEPENDENT_AMBULATORY_CARE_PROVIDER_SITE_OTHER): Payer: Medicare Other | Admitting: Internal Medicine

## 2013-03-18 ENCOUNTER — Encounter: Payer: Self-pay | Admitting: Internal Medicine

## 2013-03-18 VITALS — BP 112/70 | HR 68 | Temp 97.7°F | Resp 20 | Ht 65.0 in | Wt 203.0 lb

## 2013-03-18 DIAGNOSIS — Z0000 Encounter for general adult medical examination without abnormal findings: Secondary | ICD-10-CM

## 2013-03-18 DIAGNOSIS — E1159 Type 2 diabetes mellitus with other circulatory complications: Secondary | ICD-10-CM

## 2013-03-18 DIAGNOSIS — E1059 Type 1 diabetes mellitus with other circulatory complications: Secondary | ICD-10-CM

## 2013-03-18 DIAGNOSIS — G912 (Idiopathic) normal pressure hydrocephalus: Secondary | ICD-10-CM

## 2013-03-18 DIAGNOSIS — I509 Heart failure, unspecified: Secondary | ICD-10-CM | POA: Insufficient documentation

## 2013-03-18 DIAGNOSIS — Z95810 Presence of automatic (implantable) cardiac defibrillator: Secondary | ICD-10-CM

## 2013-03-18 DIAGNOSIS — N289 Disorder of kidney and ureter, unspecified: Secondary | ICD-10-CM

## 2013-03-18 DIAGNOSIS — I25810 Atherosclerosis of coronary artery bypass graft(s) without angina pectoris: Secondary | ICD-10-CM

## 2013-03-18 LAB — BASIC METABOLIC PANEL
BUN: 39 mg/dL — ABNORMAL HIGH (ref 6–23)
Chloride: 101 mEq/L (ref 96–112)
Creatinine, Ser: 1.9 mg/dL — ABNORMAL HIGH (ref 0.4–1.5)
Glucose, Bld: 99 mg/dL (ref 70–99)

## 2013-03-18 NOTE — Progress Notes (Signed)
Subjective:    Patient ID: Frederick Carr, male    DOB: [birth year], 79 y.o.   MRN: [id number]  HPI  79 year old patient who is seen today to establish with our practice. He was discharged on hospital approximately 3 weeks ago for treatment of decompensated systolic heart failure. Medical problems include type 2 diabetes. He is status post ICD.  Past medical history  Family history colon cancer ( mother)  Status post CABG 2009  Normal pressure hydrocephalus. Status post VP shunt 2004 Status post ICD Diabetes mellitus type 2 History congestive heart failure  Family history father died age 1 from an MI Mother died age 51 from [place] colon cancer One brother with morbid obesity One sister in good health  Admit date: 02/22/2013  Discharge date: 02/25/2013  Admission Diagnoses:  Acute on chronic left heart systolic failure.  Bradycardia with frequent premature ventricular beats  DM, II with neuropathy  Generalized weakness  Depression  CAD  CABG  Medications non-compliance  Discharge Diagnoses:  Principle Problem: * Acute on chronic left heart systolic failure*.  Intermittent 2nd degree heart block  S/P ICD  Dilated cardiomyopathy  DM, II with neuropathy  Generalized weakness  Depression  CAD  CABG  Medications non-compliance  Hypokalemia-resolved  Discharged Condition: fair  Hospital Course: 79 years old male who moved from [place], [place] 6 months ago, has been non-compliant on his medications and presented with leg edema and weakness. He had good diuresis. His sotalol was held. Bidil and Coreg were added. His ICD was interrogated and his Ventricular rate was increased to 50/min.  He was advised to use PT, OT and OP nursing to assist him till he regained some strength.    Past Medical History  Diagnosis Date  . Diabetes mellitus without complication   . Hypertension   . Coronary artery disease   . CHF (congestive heart failure)     History   Social History  .  Marital Status: Married    Spouse Name: N/A    Number of Children: N/A  . Years of Education: N/A   Occupational History  . Not on file.   Social History Main Topics  . Smoking status: Never Smoker   . Smokeless tobacco: Not on file  . Alcohol Use: No  . Drug Use: No  . Sexually Active: Yes   Other Topics Concern  . Not on file   Social History Narrative  . No narrative on file    Past Surgical History  Procedure Laterality Date  . Coronary artery bypass graft  2009    quadruple bypass  . Ventriculoperitoneal shunt  2003    No family history on file.  No Known Allergies  Current Outpatient Prescriptions on File Prior to Visit  Medication Sig Dispense Refill  . aspirin EC 325 MG tablet Take 325 mg by mouth daily.       . carvedilol (COREG) 3.125 MG tablet Take 1 tablet (3.125 mg total) by mouth 2 (two) times daily with a meal.  60 tablet  1  . furosemide (LASIX) 40 MG tablet Take 1 tablet (40 mg total) by mouth daily.  30 tablet  1  . glipiZIDE (GLUCOTROL) 10 MG tablet Take 5 mg by mouth 2 (two) times daily before a meal.      . lisinopril (PRINIVIL,ZESTRIL) 20 MG tablet Take 20 mg by mouth at bedtime.      . metFORMIN (GLUCOPHAGE) 500 MG tablet Take 1,000 mg by mouth 2 (two) times daily with  a meal.      . Multiple Vitamin (MULTIVITAMIN WITH MINERALS) TABS Take 1 tablet by mouth daily.      Marland Kitchen PARoxetine (PAXIL) 10 MG tablet Take 10 mg by mouth at bedtime.      . potassium chloride SA (K-DUR,KLOR-CON) 20 MEQ tablet Take 20 mEq by mouth daily.        No current facility-administered medications on file prior to visit.    BP 112/70  Pulse 68  Temp(Src) 97.7 F (36.5 C) (Oral)  Resp 20  Ht 5\' 5"  (1.651 m)  Wt 203 lb (92.08 kg)  BMI 33.78 kg/m2  SpO2 99%   1. Risk factors, based on past  M,S,F history patient has known coronary artery disease and history of systolic heart failure  2.  Physical activities: Sedentary due 2 age obesity heart failure and unsteady  gait  3.  Depression/mood: History depression which has been stable  4.  Hearing: No deficits  5.  ADL's: Require some assistance in aspects of daily living  6.  Fall risk: Moderate to high due 2 gait instability uses a walker  7.  Home safety: No problems identified  8.  Height weight, and visual acuity; height and weight stable no change in visual acuity  9.  Counseling: Low-salt diet recommended. Follow cardiology  10. Lab orders based on risk factors: Hospital records and laboratory studies reviewed 44. Referral : Will need periodic cardiology followup  12. Care plan: Continue restricted salt diet and present medical regimen  13. Cognitive assessment: Alert and oriented with slightly flat affect. No cognitive dysfunction    Review of Systems  Constitutional: Negative for fever, chills, activity change, appetite change and fatigue.  HENT: Negative for hearing loss, ear pain, congestion, rhinorrhea, sneezing, mouth sores, trouble swallowing, neck pain, neck stiffness, dental problem, voice change, sinus pressure and tinnitus.   Eyes: Negative for photophobia, pain, redness and visual disturbance.  Respiratory: Negative for apnea, cough, choking, chest tightness, shortness of breath and wheezing.   Cardiovascular: Positive for leg swelling. Negative for chest pain and palpitations.  Gastrointestinal: Negative for nausea, vomiting, abdominal pain, diarrhea, constipation, blood in stool, abdominal distention, anal bleeding and rectal pain.  Genitourinary: Negative for dysuria, urgency, frequency, hematuria, flank pain, decreased urine volume, discharge, penile swelling, scrotal swelling, difficulty urinating, genital sores and testicular pain.  Musculoskeletal: Positive for gait problem. Negative for myalgias, back pain, joint swelling and arthralgias.  Skin: Negative for color change, rash and wound.  Neurological: Negative for dizziness, tremors, seizures, syncope, facial  asymmetry, speech difficulty, weakness, light-headedness, numbness and headaches.  Hematological: Negative for adenopathy. Does not bruise/bleed easily.  Psychiatric/Behavioral: Negative for suicidal ideas, hallucinations, behavioral problems, confusion, sleep disturbance, self-injury, dysphoric mood, decreased concentration and agitation. The patient is not nervous/anxious.        Objective:   Physical Exam  Constitutional: He appears well-developed and well-nourished.  HENT:  Head: Normocephalic and atraumatic.  Right Ear: External ear normal.  Left Ear: External ear normal.  Nose: Nose normal.  Mouth/Throat: Oropharynx is clear and moist.  Eyes: Conjunctivae and EOM are normal. Pupils are equal, round, and reactive to light. No scleral icterus.  Neck: Normal range of motion. Neck supple. No JVD present. No thyromegaly present.  Cardiovascular: Regular rhythm and normal heart sounds.  Exam reveals no gallop and no friction rub.   No murmur heard. Pedal pulses absent  Pulmonary/Chest: Effort normal and breath sounds normal. He exhibits no tenderness.  Status post sternotomy  Abdominal: Soft. Bowel sounds are normal. He exhibits no distension and no mass. There is no tenderness.  Genitourinary: Prostate normal and penis normal.  Musculoskeletal: Normal range of motion. He exhibits edema. He exhibits no tenderness.  Mild ankle edema  Surgical scar left knee  Lymphadenopathy:    He has no cervical adenopathy.  Neurological: He is alert. He has normal reflexes. No cranial nerve deficit. Coordination normal.  Skin: Skin is warm and dry. No rash noted.  Psychiatric: He has a normal mood and affect. His behavior is normal.          Assessment & Plan:   Preventive health examination Compensated systolic heart failure Type 2 diabetes Coronary artery disease

## 2013-03-18 NOTE — Patient Instructions (Signed)
Limit your sodium (Salt) intake   Please check your hemoglobin A1c every 3 months   

## 2013-04-13 ENCOUNTER — Encounter (HOSPITAL_COMMUNITY): Payer: Self-pay | Admitting: *Deleted

## 2013-04-13 ENCOUNTER — Other Ambulatory Visit (HOSPITAL_COMMUNITY): Payer: Self-pay | Admitting: Podiatrist

## 2013-04-13 DIAGNOSIS — L98499 Non-pressure chronic ulcer of skin of other sites with unspecified severity: Secondary | ICD-10-CM

## 2013-04-20 ENCOUNTER — Encounter (HOSPITAL_COMMUNITY): Payer: Medicare Other

## 2013-04-22 ENCOUNTER — Telehealth (HOSPITAL_COMMUNITY): Payer: Self-pay | Admitting: *Deleted

## 2013-04-27 ENCOUNTER — Telehealth (HOSPITAL_COMMUNITY): Payer: Self-pay | Admitting: *Deleted

## 2013-05-03 ENCOUNTER — Telehealth: Payer: Self-pay | Admitting: Internal Medicine

## 2013-05-03 MED ORDER — FUROSEMIDE 40 MG PO TABS: 40.0000 mg | ORAL_TABLET | Freq: Every day | ORAL | Status: AC

## 2013-05-03 NOTE — Telephone Encounter (Signed)
Pt request refill  furosemide (LASIX) 40 MG tablet  90 day supply [facility] [address] It looks like dr Algie Coffer rx's the script, but pt insisted it is Dr Kirtland Bouchard. pls advise

## 2013-05-03 NOTE — Telephone Encounter (Signed)
Pt notified Rx was sent to pharmacy. 

## 2013-05-07 ENCOUNTER — Telehealth (HOSPITAL_COMMUNITY): Payer: Self-pay | Admitting: *Deleted

## 2013-05-13 ENCOUNTER — Encounter: Payer: Self-pay | Admitting: Podiatrist

## 2013-05-13 ENCOUNTER — Ambulatory Visit (INDEPENDENT_AMBULATORY_CARE_PROVIDER_SITE_OTHER): Payer: Medicare Other | Admitting: Podiatrist

## 2013-05-13 VITALS — BP 171/93 | HR 74 | Resp 20

## 2013-05-13 DIAGNOSIS — L97411 Non-pressure chronic ulcer of right heel and midfoot limited to breakdown of skin: Secondary | ICD-10-CM

## 2013-05-13 DIAGNOSIS — L97409 Non-pressure chronic ulcer of unspecified heel and midfoot with unspecified severity: Secondary | ICD-10-CM

## 2013-05-13 NOTE — Progress Notes (Signed)
Subjective: Patient presents today with his wife for followup of ulceration right heel. Patient states "it healed". Patient states he has been applying no dressings on the heel and has had no pain with heel. Patient also asks about his vascular examination which was supposed to have been ordered for him at the last visit and he states he received no followup regarding the examination. Objective: Right foot is evaluated. Ulceration plantar right heel appears to be healed. Dry eschar is overlying the area of previous ulceration and is left undisturbed. Patient continues to have nonpalpable pedal pulses DP or PT bilaterally capillary refill time is increased bilaterally. C-shaped foot type is noted right in comparison with the left. Prominent fifth metatarsal base also noted right compared to the left. Contracture lesser digits noted right compared to the left. Assessment: Healed ulceration plantar right heel, diabetes with vascular insufficiency bilateral Plan: Recommended that the patient continue keeping a close eye on ulceration on his right heel to ensure that no redness swelling or signs of infection arise. Also agreed that a paif of diabetic shoes wound be beneficial for him and we will submit the request to his primary care physician and notify him when he is ready for measurement. A referral to vascular and vein specialist for a lower extremity arterial study has also  been submitted.  I will followup with him with the results of the vascular tests and make appropriate referrals as necessary.

## 2013-05-13 NOTE — Patient Instructions (Addendum)

## 2013-05-14 ENCOUNTER — Telehealth: Payer: Self-pay | Admitting: *Deleted

## 2013-05-14 ENCOUNTER — Telehealth: Payer: Self-pay | Admitting: Vascular Surgery

## 2013-05-14 NOTE — Telephone Encounter (Signed)
Received faxed referral from Dr. Irving Shows for bilateral ABI's for indication of right heel ulcer and non-palpable pulse.    After multiple attempts of calling and speaking with pt due to poor cellular service, Mr. Frederick Carr has declined this appointment.  A detailed voicemail for left for nurse Vikki Ports at [phone].

## 2013-05-14 NOTE — Telephone Encounter (Signed)
I strongly recommend lower extremity arterial studies due to his heel ulcer and absent pedal pulses.  He is cancelling his test at his own risk against my medical advice.

## 2013-05-14 NOTE — Telephone Encounter (Signed)
Frederick Carr states called pt multiple times on his cellphone and pt finally answer ordering her to cancel the doppler testing.

## 2013-05-20 ENCOUNTER — Telehealth (HOSPITAL_COMMUNITY): Payer: Self-pay | Admitting: *Deleted

## 2013-05-25 ENCOUNTER — Other Ambulatory Visit: Payer: Medicare Other | Admitting: *Deleted

## 2013-06-08 ENCOUNTER — Telehealth: Payer: Self-pay | Admitting: Internal Medicine

## 2013-06-08 ENCOUNTER — Other Ambulatory Visit: Payer: Medicare Other

## 2013-06-08 NOTE — Telephone Encounter (Signed)
Pt needs a note fax to [facility] stating he is a diabetic fax [phone]

## 2013-06-10 NOTE — Telephone Encounter (Signed)
Left message on voicemail to call office. Forms for [facility] are in Dr.K's office waiting for him to fill out.

## 2013-06-14 NOTE — Telephone Encounter (Signed)
Papers filled out by Dr. Amador Cunas and sent.

## 2013-06-15 NOTE — Telephone Encounter (Signed)
Pt states [facility] says they have never received our fax stating pt is a diabetic. pls advise. [facility] phone number: [phone] Pt has appt here on thurs 11/6. Pt would like a copy after we fax.

## 2013-06-17 ENCOUNTER — Encounter: Payer: Self-pay | Admitting: Internal Medicine

## 2013-06-17 ENCOUNTER — Ambulatory Visit (INDEPENDENT_AMBULATORY_CARE_PROVIDER_SITE_OTHER): Payer: Medicare Other | Admitting: Internal Medicine

## 2013-06-17 VITALS — BP 130/80 | HR 81 | Temp 97.6°F | Resp 20 | Wt 206.0 lb

## 2013-06-17 DIAGNOSIS — I509 Heart failure, unspecified: Secondary | ICD-10-CM

## 2013-06-17 DIAGNOSIS — I25810 Atherosclerosis of coronary artery bypass graft(s) without angina pectoris: Secondary | ICD-10-CM

## 2013-06-17 DIAGNOSIS — E1159 Type 2 diabetes mellitus with other circulatory complications: Secondary | ICD-10-CM

## 2013-06-17 DIAGNOSIS — L97429 Non-pressure chronic ulcer of left heel and midfoot with unspecified severity: Secondary | ICD-10-CM

## 2013-06-17 DIAGNOSIS — L97409 Non-pressure chronic ulcer of unspecified heel and midfoot with unspecified severity: Secondary | ICD-10-CM

## 2013-06-17 LAB — MICROALBUMIN / CREATININE URINE RATIO: Microalb, Ur: 93.4 mg/dL — ABNORMAL HIGH (ref 0.0–1.9)

## 2013-06-17 NOTE — Patient Instructions (Signed)
Please check your hemoglobin A1c every 3 months  Limit your sodium (Salt) intake   

## 2013-06-17 NOTE — Telephone Encounter (Signed)
Forms faxed to [facility] copy made and given to pt at appointment.

## 2013-06-17 NOTE — Progress Notes (Signed)
Subjective:    Patient ID: Frederick Carr, male    DOB: [birth year], 79 y.o.   MRN: [id number]  HPI  79 year old patient who is seen today in followup. He established with our office 3 months ago. He has been hospitalized recently for decompensated heart failure. He has type 2 diabetes which has been well-controlled. No concerns or complaints today. He did have a small ulcer involving his left heel that has healed. No shortness of breath. He has chronic lower extremity edema that has improved. No hypoglycemia. He has had a recent eye examination last month. Declines immunizations today  Past Medical History  Diagnosis Date  . Diabetes mellitus without complication   . Hypertension   . Coronary artery disease   . CHF (congestive heart failure)     History   Social History  . Marital Status: Married    Spouse Name: N/A    Number of Children: N/A  . Years of Education: N/A   Occupational History  . Not on file.   Social History Main Topics  . Smoking status: Never Smoker   . Smokeless tobacco: Not on file  . Alcohol Use: 0.5 oz/week    1 drink(s) per week     Comment: beer once in a while  . Drug Use: No  . Sexual Activity: Yes   Other Topics Concern  . Not on file   Social History Narrative  . No narrative on file    Past Surgical History  Procedure Laterality Date  . Coronary artery bypass graft  2009    quadruple bypass  . Ventriculoperitoneal shunt  2003    No family history on file.  No Known Allergies  Current Outpatient Prescriptions on File Prior to Visit  Medication Sig Dispense Refill  . aspirin EC 325 MG tablet Take 325 mg by mouth daily.       . carvedilol (COREG) 3.125 MG tablet Take 1 tablet (3.125 mg total) by mouth 2 (two) times daily with a meal.  60 tablet  1  . furosemide (LASIX) 40 MG tablet Take 1 tablet (40 mg total) by mouth daily.  90 tablet  1  . glipiZIDE (GLUCOTROL) 10 MG tablet Take 5 mg by mouth 2 (two) times daily before a meal.       . lisinopril (PRINIVIL,ZESTRIL) 20 MG tablet Take 20 mg by mouth at bedtime.      . metFORMIN (GLUCOPHAGE) 500 MG tablet Take 1,000 mg by mouth 2 (two) times daily with a meal.      . Multiple Vitamin (MULTIVITAMIN WITH MINERALS) TABS Take 1 tablet by mouth daily.      . potassium chloride SA (K-DUR,KLOR-CON) 20 MEQ tablet Take 20 mEq by mouth daily.       Marland Kitchen PARoxetine (PAXIL) 10 MG tablet Take 10 mg by mouth at bedtime.       No current facility-administered medications on file prior to visit.    BP 130/80  Pulse 81  Temp(Src) 97.6 F (36.4 C) (Oral)  Resp 20  Wt 206 lb (93.441 kg)  SpO2 97%      Wt Readings from Last 3 Encounters:  06/17/13 206 lb (93.441 kg)  03/18/13 203 lb (92.08 kg)  02/25/13 203 lb 3.2 oz (92.171 kg)   Review of Systems  Constitutional: Negative for fever, chills, appetite change and fatigue.  HENT: Negative for congestion, dental problem, ear pain, hearing loss, sore throat, tinnitus, trouble swallowing and voice change.   Eyes: Negative for pain, discharge  and visual disturbance.  Respiratory: Negative for cough, chest tightness, wheezing and stridor.   Cardiovascular: Positive for leg swelling. Negative for chest pain and palpitations.  Gastrointestinal: Negative for nausea, vomiting, abdominal pain, diarrhea, constipation, blood in stool and abdominal distention.  Genitourinary: Negative for urgency, hematuria, flank pain, discharge, difficulty urinating and genital sores.  Musculoskeletal: Positive for back pain and gait problem. Negative for arthralgias, joint swelling, myalgias and neck stiffness.  Skin: Negative for rash.  Neurological: Negative for dizziness, syncope, speech difficulty, weakness, numbness and headaches.  Hematological: Negative for adenopathy. Does not bruise/bleed easily.  Psychiatric/Behavioral: Negative for behavioral problems and dysphoric mood. The patient is not nervous/anxious.        Objective:   Physical Exam   Constitutional: He is oriented to person, place, and time. He appears well-developed and well-nourished. No distress.  HENT:  Head: Normocephalic.  Right Ear: External ear normal.  Left Ear: External ear normal.  Eyes: Conjunctivae and EOM are normal.  Neck: Normal range of motion.  Cardiovascular: Normal rate and normal heart sounds.   Pedal pulses absent  Pulmonary/Chest: Breath sounds normal.  Abdominal: Bowel sounds are normal.  Musculoskeletal: Normal range of motion. He exhibits edema. He exhibits no tenderness.  +2 edema  Neurological: He is alert and oriented to person, place, and time.  Psychiatric: He has a normal mood and affect. His behavior is normal.          Assessment & Plan:

## 2013-06-23 ENCOUNTER — Telehealth: Payer: Self-pay | Admitting: Internal Medicine

## 2013-06-23 MED ORDER — PAROXETINE HCL 10 MG PO TABS: 10.0000 mg | ORAL_TABLET | Freq: Every day | ORAL | Status: AC

## 2013-06-23 NOTE — Telephone Encounter (Signed)
Spoke to pt told him Hemoglobin A1c was 5.9 normal. Pt verbalized understanding. Pt also needed refill on Paxil sent to pharmacy. Told pt will send Rx to pharmacy. Pt verbalized understanding. Rx sent to pharmacy.

## 2013-06-23 NOTE — Telephone Encounter (Signed)
Pt would like a1c results. Pt also has a question concerning a med. Pt would like nurse to return his call.

## 2013-06-25 ENCOUNTER — Ambulatory Visit (INDEPENDENT_AMBULATORY_CARE_PROVIDER_SITE_OTHER): Payer: Medicare Other | Admitting: *Deleted

## 2013-06-25 DIAGNOSIS — E1159 Type 2 diabetes mellitus with other circulatory complications: Secondary | ICD-10-CM

## 2013-06-25 NOTE — Patient Instructions (Signed)
Our office will notify you once your diabetic shoes and insoles arrive. At that time an appointment will be needed to pick them up.  

## 2013-06-25 NOTE — Progress Notes (Signed)
Measured for diabetic shoes and insoles. 

## 2013-07-07 ENCOUNTER — Encounter (HOSPITAL_COMMUNITY): Payer: Self-pay | Admitting: Emergency Medicine

## 2013-07-07 ENCOUNTER — Inpatient Hospital Stay (HOSPITAL_COMMUNITY)
Admission: EM | Admit: 2013-07-07 | Discharge: 2013-08-12 | DRG: 025 | Disposition: E | Payer: Medicare Other | Attending: Neurosurgery | Admitting: Neurosurgery

## 2013-07-07 ENCOUNTER — Emergency Department (HOSPITAL_COMMUNITY): Payer: Medicare Other | Admitting: Anesthesiology

## 2013-07-07 ENCOUNTER — Emergency Department (HOSPITAL_COMMUNITY): Payer: Medicare Other

## 2013-07-07 ENCOUNTER — Encounter (HOSPITAL_COMMUNITY): Admission: EM | Disposition: E | Payer: Self-pay | Source: Home / Self Care | Attending: Neurosurgery

## 2013-07-07 ENCOUNTER — Encounter (HOSPITAL_COMMUNITY): Payer: Medicare Other | Admitting: Anesthesiology

## 2013-07-07 DIAGNOSIS — E1159 Type 2 diabetes mellitus with other circulatory complications: Secondary | ICD-10-CM

## 2013-07-07 DIAGNOSIS — I472 Ventricular tachycardia, unspecified: Secondary | ICD-10-CM

## 2013-07-07 DIAGNOSIS — G912 (Idiopathic) normal pressure hydrocephalus: Secondary | ICD-10-CM

## 2013-07-07 DIAGNOSIS — R569 Unspecified convulsions: Secondary | ICD-10-CM | POA: Diagnosis not present

## 2013-07-07 DIAGNOSIS — Z515 Encounter for palliative care: Secondary | ICD-10-CM

## 2013-07-07 DIAGNOSIS — S064X9A Epidural hemorrhage with loss of consciousness of unspecified duration, initial encounter: Secondary | ICD-10-CM | POA: Diagnosis present

## 2013-07-07 DIAGNOSIS — E876 Hypokalemia: Secondary | ICD-10-CM | POA: Diagnosis not present

## 2013-07-07 DIAGNOSIS — S12600A Unspecified displaced fracture of seventh cervical vertebra, initial encounter for closed fracture: Secondary | ICD-10-CM | POA: Diagnosis present

## 2013-07-07 DIAGNOSIS — S0100XA Unspecified open wound of scalp, initial encounter: Secondary | ICD-10-CM | POA: Diagnosis present

## 2013-07-07 DIAGNOSIS — I498 Other specified cardiac arrhythmias: Secondary | ICD-10-CM | POA: Diagnosis not present

## 2013-07-07 DIAGNOSIS — Z66 Do not resuscitate: Secondary | ICD-10-CM | POA: Diagnosis not present

## 2013-07-07 DIAGNOSIS — I4729 Other ventricular tachycardia: Secondary | ICD-10-CM | POA: Diagnosis not present

## 2013-07-07 DIAGNOSIS — R4020 Unspecified coma: Secondary | ICD-10-CM | POA: Diagnosis not present

## 2013-07-07 DIAGNOSIS — I5020 Unspecified systolic (congestive) heart failure: Secondary | ICD-10-CM

## 2013-07-07 DIAGNOSIS — Z79899 Other long term (current) drug therapy: Secondary | ICD-10-CM

## 2013-07-07 DIAGNOSIS — S065X9A Traumatic subdural hemorrhage with loss of consciousness of unspecified duration, initial encounter: Secondary | ICD-10-CM | POA: Diagnosis present

## 2013-07-07 DIAGNOSIS — I10 Essential (primary) hypertension: Secondary | ICD-10-CM

## 2013-07-07 DIAGNOSIS — I25810 Atherosclerosis of coronary artery bypass graft(s) without angina pectoris: Secondary | ICD-10-CM

## 2013-07-07 DIAGNOSIS — G936 Cerebral edema: Secondary | ICD-10-CM | POA: Diagnosis not present

## 2013-07-07 DIAGNOSIS — S064XAA Epidural hemorrhage with loss of consciousness status unknown, initial encounter: Secondary | ICD-10-CM | POA: Diagnosis present

## 2013-07-07 DIAGNOSIS — I798 Other disorders of arteries, arterioles and capillaries in diseases classified elsewhere: Secondary | ICD-10-CM | POA: Diagnosis present

## 2013-07-07 DIAGNOSIS — Z951 Presence of aortocoronary bypass graft: Secondary | ICD-10-CM

## 2013-07-07 DIAGNOSIS — R063 Periodic breathing: Secondary | ICD-10-CM | POA: Diagnosis not present

## 2013-07-07 DIAGNOSIS — S065XAA Traumatic subdural hemorrhage with loss of consciousness status unknown, initial encounter: Principal | ICD-10-CM

## 2013-07-07 DIAGNOSIS — D649 Anemia, unspecified: Secondary | ICD-10-CM | POA: Diagnosis present

## 2013-07-07 DIAGNOSIS — J9600 Acute respiratory failure, unspecified whether with hypoxia or hypercapnia: Secondary | ICD-10-CM

## 2013-07-07 DIAGNOSIS — I509 Heart failure, unspecified: Secondary | ICD-10-CM

## 2013-07-07 DIAGNOSIS — I5022 Chronic systolic (congestive) heart failure: Secondary | ICD-10-CM | POA: Diagnosis present

## 2013-07-07 DIAGNOSIS — Z95810 Presence of automatic (implantable) cardiac defibrillator: Secondary | ICD-10-CM

## 2013-07-07 DIAGNOSIS — N179 Acute kidney failure, unspecified: Secondary | ICD-10-CM

## 2013-07-07 DIAGNOSIS — I2510 Atherosclerotic heart disease of native coronary artery without angina pectoris: Secondary | ICD-10-CM | POA: Diagnosis present

## 2013-07-07 DIAGNOSIS — Z982 Presence of cerebrospinal fluid drainage device: Secondary | ICD-10-CM

## 2013-07-07 DIAGNOSIS — I493 Ventricular premature depolarization: Secondary | ICD-10-CM

## 2013-07-07 DIAGNOSIS — E870 Hyperosmolality and hypernatremia: Secondary | ICD-10-CM | POA: Diagnosis not present

## 2013-07-07 DIAGNOSIS — Z7982 Long term (current) use of aspirin: Secondary | ICD-10-CM

## 2013-07-07 HISTORY — PX: CRANIOTOMY: SHX93

## 2013-07-07 HISTORY — DX: Unspecified systolic (congestive) heart failure: I50.20

## 2013-07-07 LAB — URINALYSIS, ROUTINE W REFLEX MICROSCOPIC
Bilirubin Urine: NEGATIVE
Glucose, UA: 250 mg/dL — AB
Ketones, ur: 15 mg/dL — AB
Leukocytes, UA: NEGATIVE
Protein, ur: 100 mg/dL — AB
Urobilinogen, UA: 0.2 mg/dL (ref 0.0–1.0)

## 2013-07-07 LAB — COMPREHENSIVE METABOLIC PANEL
ALT: 16 U/L (ref 0–53)
BUN: 23 mg/dL (ref 6–23)
CO2: 24 mEq/L (ref 19–32)
Calcium: 9.3 mg/dL (ref 8.4–10.5)
Creatinine, Ser: 1.29 mg/dL (ref 0.50–1.35)
GFR calc Af Amer: 59 mL/min — ABNORMAL LOW (ref >90)
GFR calc non Af Amer: 51 mL/min — ABNORMAL LOW (ref >90)
Glucose, Bld: 171 mg/dL — ABNORMAL HIGH (ref 70–99)
Sodium: 137 mEq/L (ref 135–145)
Total Protein: 7.8 g/dL (ref 6.0–8.3)

## 2013-07-07 LAB — CBC WITH DIFFERENTIAL/PLATELET
Eosinophils Absolute: 0.3 10*3/uL (ref 0.0–0.7)
Eosinophils Relative: 3 % (ref 0–5)
HCT: 43.1 % (ref 39.0–52.0)
Lymphocytes Relative: 19 % (ref 12–46)
Lymphs Abs: 2 10*3/uL (ref 0.7–4.0)
MCH: 32.3 pg (ref 26.0–34.0)
MCV: 89.8 fL (ref 78.0–100.0)
Monocytes Absolute: 0.8 10*3/uL (ref 0.1–1.0)
Monocytes Relative: 8 % (ref 3–12)
Platelets: 214 10*3/uL (ref 150–400)
RBC: 4.8 MIL/uL (ref 4.22–5.81)
WBC: 10.4 10*3/uL (ref 4.0–10.5)

## 2013-07-07 LAB — GLUCOSE, CAPILLARY: Glucose-Capillary: 142 mg/dL — ABNORMAL HIGH (ref 70–99)

## 2013-07-07 LAB — URINE MICROSCOPIC-ADD ON

## 2013-07-07 LAB — PROTIME-INR
INR: 1.06 (ref 0.00–1.49)
Prothrombin Time: 13.6 seconds (ref 11.6–15.2)

## 2013-07-07 LAB — ABO/RH: ABO/RH(D): B POS

## 2013-07-07 LAB — TYPE AND SCREEN
ABO/RH(D): B POS
Antibody Screen: NEGATIVE

## 2013-07-07 SURGERY — CRANIOTOMY HEMATOMA EVACUATION SUBDURAL
Anesthesia: General | Wound class: Clean

## 2013-07-07 MED ORDER — THROMBIN 5000 UNITS EX SOLR
OROMUCOSAL | Status: DC | PRN
  Administered 2013-07-07 – 2013-07-08 (×2): via TOPICAL

## 2013-07-07 MED ORDER — FENTANYL CITRATE 0.05 MG/ML IJ SOLN
INTRAMUSCULAR | Status: DC | PRN
  Administered 2013-07-07 (×2): 50 ug via INTRAVENOUS
  Administered 2013-07-07: 100 ug via INTRAVENOUS
  Administered 2013-07-07: 50 ug via INTRAVENOUS

## 2013-07-07 MED ORDER — LACTATED RINGERS IV SOLN
INTRAVENOUS | Status: DC | PRN
  Administered 2013-07-07: 23:00:00 via INTRAVENOUS

## 2013-07-07 MED ORDER — CEFAZOLIN SODIUM-DEXTROSE 2-3 GM-% IV SOLR
INTRAVENOUS | Status: DC | PRN
  Administered 2013-07-07: 2 g via INTRAVENOUS

## 2013-07-07 MED ORDER — ETOMIDATE 2 MG/ML IV SOLN
20.0000 mg | Freq: Once | INTRAVENOUS | Status: AC
  Administered 2013-07-07: 20 mg via INTRAVENOUS

## 2013-07-07 MED ORDER — SODIUM CHLORIDE 0.9 % IV SOLN
1000.0000 mg | INTRAVENOUS | Status: AC
  Administered 2013-07-08: 1000 mg via INTRAVENOUS
  Filled 2013-07-07 (×2): qty 10

## 2013-07-07 MED ORDER — SODIUM CHLORIDE 0.9 % IR SOLN
Status: DC | PRN
  Administered 2013-07-07

## 2013-07-07 MED ORDER — 0.9 % SODIUM CHLORIDE (POUR BTL) OPTIME
TOPICAL | Status: DC | PRN
  Administered 2013-07-07 – 2013-07-08 (×3): 1000 mL

## 2013-07-07 MED ORDER — LIDOCAINE-EPINEPHRINE 1 %-1:100000 IJ SOLN
INTRAMUSCULAR | Status: DC | PRN
  Administered 2013-07-07: 9 mL via INTRADERMAL

## 2013-07-07 MED ORDER — THROMBIN 20000 UNITS EX SOLR
CUTANEOUS | Status: DC | PRN
  Administered 2013-07-07: via TOPICAL

## 2013-07-07 MED ORDER — IOHEXOL 300 MG/ML  SOLN
100.0000 mL | Freq: Once | INTRAMUSCULAR | Status: AC | PRN
  Administered 2013-07-07: 100 mL via INTRAVENOUS

## 2013-07-07 MED ORDER — SUCCINYLCHOLINE CHLORIDE 20 MG/ML IJ SOLN
125.0000 mg | Freq: Once | INTRAMUSCULAR | Status: AC
  Administered 2013-07-07: 125 mg via INTRAVENOUS

## 2013-07-07 MED ORDER — TETANUS-DIPHTH-ACELL PERTUSSIS 5-2.5-18.5 LF-MCG/0.5 IM SUSP: 0.5000 mL | Freq: Once | INTRAMUSCULAR | Status: DC

## 2013-07-07 MED ORDER — BUPIVACAINE HCL (PF) 0.5 % IJ SOLN
INTRAMUSCULAR | Status: DC | PRN
  Administered 2013-07-07: 9 mL

## 2013-07-07 MED ORDER — ROCURONIUM BROMIDE 100 MG/10ML IV SOLN
INTRAVENOUS | Status: DC | PRN
  Administered 2013-07-07 – 2013-07-08 (×3): 50 mg via INTRAVENOUS

## 2013-07-07 MED ORDER — PROPOFOL 10 MG/ML IV EMUL
INTRAVENOUS | Status: AC
  Administered 2013-07-07: 1000 mg
  Filled 2013-07-07: qty 100

## 2013-07-07 MED ORDER — MICROFIBRILLAR COLL HEMOSTAT EX PADS
MEDICATED_PAD | CUTANEOUS | Status: DC | PRN
  Administered 2013-07-07: 1 via TOPICAL

## 2013-07-07 MED ORDER — SODIUM CHLORIDE 0.9 % IV SOLN
INTRAVENOUS | Status: DC
  Administered 2013-07-07 – 2013-07-08 (×3): via INTRAVENOUS

## 2013-07-07 SURGICAL SUPPLY — 81 items
BANDAGE GAUZE 4  KLING STR (GAUZE/BANDAGES/DRESSINGS) ×2 IMPLANT
BANDAGE GAUZE ELAST BULKY 4 IN (GAUZE/BANDAGES/DRESSINGS) ×2 IMPLANT
BENZOIN TINCTURE PRP APPL 2/3 (GAUZE/BANDAGES/DRESSINGS) ×2 IMPLANT
BLADE SURG ROTATE 9660 (MISCELLANEOUS) ×2 IMPLANT
BLADE ULTRA TIP 2M (BLADE) IMPLANT
BRUSH SCRUB EZ 1% IODOPHOR (MISCELLANEOUS) IMPLANT
BUR ACORN 6.0 PRECISION (BURR) ×2 IMPLANT
BUR ADDG 1.1 (BURR) IMPLANT
BUR MATCHSTICK NEURO 3.0 LAGG (BURR) ×2 IMPLANT
BUR ROUTER D-58 CRANI (BURR) IMPLANT
CANISTER SUCT 3000ML (MISCELLANEOUS) ×2 IMPLANT
CLIP TI MEDIUM 6 (CLIP) IMPLANT
CONT SPEC 4OZ CLIKSEAL STRL BL (MISCELLANEOUS) ×4 IMPLANT
CORDS BIPOLAR (ELECTRODE) ×2 IMPLANT
DRAIN SNY WOU 7FLT (WOUND CARE) IMPLANT
DRAPE NEUROLOGICAL W/INCISE (DRAPES) ×2 IMPLANT
DRAPE SURG 17X23 STRL (DRAPES) IMPLANT
DRAPE WARM FLUID 44X44 (DRAPE) ×2 IMPLANT
DRESSING TELFA 8X3 (GAUZE/BANDAGES/DRESSINGS) IMPLANT
DURAFORM SPONGE 2X2 SINGLE (Neuro Prosthesis/Implant) ×4 IMPLANT
DURAPREP 26ML APPLICATOR (WOUND CARE) ×4 IMPLANT
DURAPREP 6ML APPLICATOR 50/CS (WOUND CARE) IMPLANT
ELECT CAUTERY BLADE 6.4 (BLADE) ×2 IMPLANT
ELECT REM PT RETURN 9FT ADLT (ELECTROSURGICAL) ×2
ELECTRODE REM PT RTRN 9FT ADLT (ELECTROSURGICAL) ×1 IMPLANT
EVACUATOR 1/8 PVC DRAIN (DRAIN) ×2 IMPLANT
EVACUATOR SILICONE 100CC (DRAIN) IMPLANT
GAUZE SPONGE 4X4 16PLY XRAY LF (GAUZE/BANDAGES/DRESSINGS) IMPLANT
GLOVE BIOGEL PI IND STRL 7.0 (GLOVE) ×2 IMPLANT
GLOVE BIOGEL PI IND STRL 7.5 (GLOVE) ×2 IMPLANT
GLOVE BIOGEL PI INDICATOR 7.0 (GLOVE) ×2
GLOVE BIOGEL PI INDICATOR 7.5 (GLOVE) ×2
GLOVE ECLIPSE 7.0 STRL STRAW (GLOVE) ×2 IMPLANT
GLOVE EXAM NITRILE LRG STRL (GLOVE) IMPLANT
GLOVE EXAM NITRILE MD LF STRL (GLOVE) IMPLANT
GLOVE EXAM NITRILE XL STR (GLOVE) IMPLANT
GLOVE EXAM NITRILE XS STR PU (GLOVE) IMPLANT
GLOVE SS BIOGEL STRL SZ 6.5 (GLOVE) ×1 IMPLANT
GLOVE SUPERSENSE BIOGEL SZ 6.5 (GLOVE) ×1
GLOVE SURG SS PI 6.5 STRL IVOR (GLOVE) ×2 IMPLANT
GOWN BRE IMP SLV AUR LG STRL (GOWN DISPOSABLE) ×6 IMPLANT
GOWN BRE IMP SLV AUR XL STRL (GOWN DISPOSABLE) ×2 IMPLANT
GOWN STRL REIN 2XL LVL4 (GOWN DISPOSABLE) IMPLANT
HEMOSTAT POWDER SURGIFOAM 1G (HEMOSTASIS) ×4 IMPLANT
HEMOSTAT SURGICEL 2X14 (HEMOSTASIS) IMPLANT
HOOK DURA (MISCELLANEOUS) ×2 IMPLANT
KIT BASIN OR (CUSTOM PROCEDURE TRAY) ×2 IMPLANT
KIT ROOM TURNOVER OR (KITS) ×2 IMPLANT
NEEDLE HYPO 22GX1.5 SAFETY (NEEDLE) ×2 IMPLANT
NEEDLE HYPO 25X1 1.5 SAFETY (NEEDLE) IMPLANT
NS IRRIG 1000ML POUR BTL (IV SOLUTION) ×6 IMPLANT
PACK CRANIOTOMY (CUSTOM PROCEDURE TRAY) ×2 IMPLANT
PATTIES SURGICAL .5 X.5 (GAUZE/BANDAGES/DRESSINGS) IMPLANT
PATTIES SURGICAL .5 X3 (DISPOSABLE) IMPLANT
PATTIES SURGICAL 1X1 (DISPOSABLE) IMPLANT
SPONGE GAUZE 4X4 12PLY (GAUZE/BANDAGES/DRESSINGS) ×4 IMPLANT
SPONGE NEURO XRAY DETECT 1X3 (DISPOSABLE) IMPLANT
SPONGE SURGIFOAM ABS GEL 100 (HEMOSTASIS) ×2 IMPLANT
STAPLER SKIN PROX WIDE 3.9 (STAPLE) IMPLANT
STAPLER VISISTAT 35W (STAPLE) ×4 IMPLANT
STOCKINETTE 6  STRL (DRAPES) ×1
STOCKINETTE 6 STRL (DRAPES) ×1 IMPLANT
SUT ETHILON 3 0 FSL (SUTURE) ×2 IMPLANT
SUT ETHILON 3 0 PS 1 (SUTURE) IMPLANT
SUT NURALON 4 0 TR CR/8 (SUTURE) ×2 IMPLANT
SUT PL GUT 3 0 FS 1 (SUTURE) IMPLANT
SUT STEEL 0 (SUTURE)
SUT STEEL 0 18XMFL TIE 17 (SUTURE) IMPLANT
SUT VIC AB 0 CT1 18XCR BRD8 (SUTURE) IMPLANT
SUT VIC AB 0 CT1 8-18 (SUTURE)
SUT VIC AB 2-0 CP2 18 (SUTURE) ×6 IMPLANT
SUT VIC AB 3-0 SH 8-18 (SUTURE) ×10 IMPLANT
SYR 20ML ECCENTRIC (SYRINGE) ×2 IMPLANT
SYR CONTROL 10ML LL (SYRINGE) ×2 IMPLANT
TAPE CLOTH SURG 4X10 WHT LF (GAUZE/BANDAGES/DRESSINGS) ×4 IMPLANT
TOWEL OR 17X24 6PK STRL BLUE (TOWEL DISPOSABLE) ×2 IMPLANT
TOWEL OR 17X26 10 PK STRL BLUE (TOWEL DISPOSABLE) ×2 IMPLANT
TRAY FOLEY CATH 14FRSI W/METER (CATHETERS) IMPLANT
TUBE CONNECTING 12X1/4 (SUCTIONS) ×4 IMPLANT
UNDERPAD 30X30 INCONTINENT (UNDERPADS AND DIAPERS) ×2 IMPLANT
WATER STERILE IRR 1000ML POUR (IV SOLUTION) ×2 IMPLANT
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
## 2013-07-07 NOTE — ED Notes (Signed)
Upon return from CT, pt appears to not follow commands, gaze was shifted to right, respirations increased and labored, drooling/foaming at mouth, not answering questions, moaning. Dr. Rosalia Hammers at bedside. Respiratory called. Airway cart placed at bedside, zoll placed at bedside.
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
## 2013-07-07 NOTE — ED Notes (Addendum)
Pt lying in bed, A&O, able to follow commands, but slow to respond when answering questions and asking him to follow commands.
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
## 2013-07-07 NOTE — Anesthesia Preprocedure Evaluation (Signed)
Anesthesia Evaluation  Patient identified by MRN, date of birth, ID band Patient unresponsive    Reviewed: Unable to perform ROS - Chart review only  Airway       Dental   Pulmonary          Cardiovascular hypertension, + CAD and + CABG + Cardiac Defibrillator     Neuro/Psych    GI/Hepatic   Endo/Other  diabetes, Type 2, Oral Hypoglycemic Agents  Renal/GU      Musculoskeletal   Abdominal   Peds  Hematology   Anesthesia Other Findings   Reproductive/Obstetrics                           Anesthesia Physical Anesthesia Plan  ASA: IV and emergent  Anesthesia Plan: General   Post-op Pain Management:    Induction: Intravenous  Airway Management Planned: Oral ETT  Additional Equipment: Arterial line  Intra-op Plan:   Post-operative Plan: Post-operative intubation/ventilation  Informed Consent: I have reviewed the patients History and Physical, chart, labs and discussed the procedure including the risks, benefits and alternatives for the proposed anesthesia with the patient or authorized representative who has indicated his/her understanding and acceptance.     Plan Discussed with: CRNA and Surgeon  Anesthesia Plan Comments:         Anesthesia Quick Evaluation

## 2013-07-08 ENCOUNTER — Inpatient Hospital Stay (HOSPITAL_COMMUNITY): Payer: Medicare Other

## 2013-07-08 DIAGNOSIS — I493 Ventricular premature depolarization: Secondary | ICD-10-CM | POA: Diagnosis present

## 2013-07-08 DIAGNOSIS — J9600 Acute respiratory failure, unspecified whether with hypoxia or hypercapnia: Secondary | ICD-10-CM

## 2013-07-08 DIAGNOSIS — I10 Essential (primary) hypertension: Secondary | ICD-10-CM

## 2013-07-08 DIAGNOSIS — S065X9A Traumatic subdural hemorrhage with loss of consciousness of unspecified duration, initial encounter: Secondary | ICD-10-CM | POA: Diagnosis present

## 2013-07-08 LAB — BASIC METABOLIC PANEL
BUN: 22 mg/dL (ref 6–23)
CO2: 21 mEq/L (ref 19–32)
Calcium: 8.1 mg/dL — ABNORMAL LOW (ref 8.4–10.5)
Chloride: 104 mEq/L (ref 96–112)
GFR calc non Af Amer: 59 mL/min — ABNORMAL LOW (ref >90)
Glucose, Bld: 220 mg/dL — ABNORMAL HIGH (ref 70–99)
Potassium: 3.5 mEq/L (ref 3.5–5.1)

## 2013-07-08 LAB — GLUCOSE, CAPILLARY
Glucose-Capillary: 146 mg/dL — ABNORMAL HIGH (ref 70–99)
Glucose-Capillary: 153 mg/dL — ABNORMAL HIGH (ref 70–99)

## 2013-07-08 LAB — CBC
Hemoglobin: 11.4 g/dL — ABNORMAL LOW (ref 13.0–17.0)
MCH: 31.1 pg (ref 26.0–34.0)
MCHC: 35 g/dL (ref 30.0–36.0)
MCV: 89.1 fL (ref 78.0–100.0)
Platelets: 220 10*3/uL (ref 150–400)

## 2013-07-08 LAB — PREPARE PLATELET PHERESIS: Unit division: 0

## 2013-07-08 LAB — BLOOD GAS, ARTERIAL
Acid-base deficit: 0.7 mmol/L (ref 0.0–2.0)
Bicarbonate: 22.9 mEq/L (ref 20.0–24.0)
Drawn by: 331761
FIO2: 0.8 %
MECHVT: 550 mL
PEEP: 5 cmH2O
RATE: 15 resp/min
TCO2: 23.9 mmol/L (ref 0–100)
pCO2 arterial: 34.2 mmHg — ABNORMAL LOW (ref 35.0–45.0)
pH, Arterial: 7.44 (ref 7.350–7.450)
pO2, Arterial: 335 mmHg — ABNORMAL HIGH (ref 80.0–100.0)

## 2013-07-08 MED ORDER — PAROXETINE HCL 10 MG PO TABS
10.0000 mg | ORAL_TABLET | Freq: Every day | ORAL | Status: DC
  Administered 2013-07-08: 10 mg via ORAL
  Filled 2013-07-08 (×3): qty 1

## 2013-07-08 MED ORDER — BACITRACIN ZINC 500 UNIT/GM EX OINT
TOPICAL_OINTMENT | CUTANEOUS | Status: DC | PRN
  Administered 2013-07-08: 1 via TOPICAL

## 2013-07-08 MED ORDER — MORPHINE SULFATE 2 MG/ML IJ SOLN: 2.0000 mg | INTRAMUSCULAR | Status: DC | PRN

## 2013-07-08 MED ORDER — HYDRALAZINE HCL 20 MG/ML IJ SOLN
10.0000 mg | INTRAMUSCULAR | Status: DC | PRN
  Administered 2013-07-08 – 2013-07-14 (×6): 20 mg via INTRAVENOUS
  Filled 2013-07-08 (×5): qty 1

## 2013-07-08 MED ORDER — CHLORHEXIDINE GLUCONATE 0.12 % MT SOLN
15.0000 mL | Freq: Two times a day (BID) | OROMUCOSAL | Status: DC
  Administered 2013-07-08 – 2013-07-10 (×6): 15 mL via OROMUCOSAL
  Filled 2013-07-08 (×6): qty 15

## 2013-07-08 MED ORDER — FUROSEMIDE 40 MG PO TABS
40.0000 mg | ORAL_TABLET | Freq: Every day | ORAL | Status: DC
  Filled 2013-07-08: qty 1

## 2013-07-08 MED ORDER — CARVEDILOL 3.125 MG PO TABS
3.1250 mg | ORAL_TABLET | Freq: Two times a day (BID) | ORAL | Status: DC
Start: 2013-07-08 — End: 2013-07-15
  Administered 2013-07-08 – 2013-07-15 (×14): 3.125 mg
  Filled 2013-07-08 (×17): qty 1

## 2013-07-08 MED ORDER — SODIUM CHLORIDE 0.9 % IV SOLN
500.0000 mg | Freq: Two times a day (BID) | INTRAVENOUS | Status: DC
  Administered 2013-07-08 – 2013-07-14 (×14): 500 mg via INTRAVENOUS
  Filled 2013-07-08 (×16): qty 5

## 2013-07-08 MED ORDER — POTASSIUM CHLORIDE CRYS ER 20 MEQ PO TBCR
20.0000 meq | EXTENDED_RELEASE_TABLET | Freq: Every day | ORAL | Status: DC
  Filled 2013-07-08: qty 1

## 2013-07-08 MED ORDER — GLIPIZIDE 5 MG PO TABS
5.0000 mg | ORAL_TABLET | Freq: Two times a day (BID) | ORAL | Status: DC
  Filled 2013-07-08 (×3): qty 1

## 2013-07-08 MED ORDER — ONDANSETRON HCL 4 MG PO TABS: 4.0000 mg | ORAL_TABLET | ORAL | Status: DC | PRN

## 2013-07-08 MED ORDER — METFORMIN HCL 500 MG PO TABS
1000.0000 mg | ORAL_TABLET | Freq: Two times a day (BID) | ORAL | Status: DC
  Filled 2013-07-08 (×3): qty 2

## 2013-07-08 MED ORDER — PROMETHAZINE HCL 25 MG PO TABS: 12.5000 mg | ORAL_TABLET | ORAL | Status: DC | PRN

## 2013-07-08 MED ORDER — PANTOPRAZOLE SODIUM 40 MG IV SOLR
40.0000 mg | Freq: Every day | INTRAVENOUS | Status: DC
  Administered 2013-07-08 (×2): 40 mg via INTRAVENOUS
  Filled 2013-07-08 (×2): qty 40

## 2013-07-08 MED ORDER — LISINOPRIL 20 MG PO TABS
20.0000 mg | ORAL_TABLET | Freq: Every day | ORAL | Status: DC
  Administered 2013-07-08: 20 mg via ORAL
  Filled 2013-07-08 (×3): qty 1

## 2013-07-08 MED ORDER — BIOTENE DRY MOUTH MT LIQD
15.0000 mL | Freq: Two times a day (BID) | OROMUCOSAL | Status: DC
  Administered 2013-07-08 – 2013-07-10 (×6): 15 mL via OROMUCOSAL

## 2013-07-08 MED ORDER — INSULIN ASPART 100 UNIT/ML ~~LOC~~ SOLN: 0.0000 [IU] | Freq: Three times a day (TID) | SUBCUTANEOUS | Status: DC

## 2013-07-08 MED ORDER — POTASSIUM CHLORIDE 20 MEQ/15ML (10%) PO LIQD
40.0000 meq | Freq: Three times a day (TID) | ORAL | Status: AC
  Administered 2013-07-08 (×2): 40 meq
  Filled 2013-07-08 (×2): qty 30

## 2013-07-08 MED ORDER — VITAL AF 1.2 CAL PO LIQD
1000.0000 mL | ORAL | Status: DC
  Administered 2013-07-08: 1000 mL
  Filled 2013-07-08 (×3): qty 1000

## 2013-07-08 MED ORDER — PHENYLEPHRINE HCL 10 MG/ML IJ SOLN
10.0000 mg | INTRAVENOUS | Status: DC | PRN
  Administered 2013-07-08: 50 ug/min via INTRAVENOUS

## 2013-07-08 MED ORDER — SODIUM CHLORIDE 0.9 % IV SOLN: INTRAVENOUS | Status: DC

## 2013-07-08 MED ORDER — PROPOFOL INFUSION 10 MG/ML OPTIME
INTRAVENOUS | Status: DC | PRN
  Administered 2013-07-08: 50 ug/kg/min via INTRAVENOUS

## 2013-07-08 MED ORDER — ADULT MULTIVITAMIN W/MINERALS CH
1.0000 | ORAL_TABLET | Freq: Every day | ORAL | Status: DC
  Filled 2013-07-08: qty 1

## 2013-07-08 MED ORDER — LABETALOL HCL 5 MG/ML IV SOLN
10.0000 mg | INTRAVENOUS | Status: DC | PRN
  Administered 2013-07-08 – 2013-07-14 (×4): 20 mg via INTRAVENOUS
  Filled 2013-07-08 (×3): qty 4
  Filled 2013-07-08: qty 8
  Filled 2013-07-08: qty 4

## 2013-07-08 MED ORDER — ONDANSETRON HCL 4 MG/2ML IJ SOLN: 4.0000 mg | INTRAMUSCULAR | Status: DC | PRN

## 2013-07-08 MED ORDER — CARVEDILOL 3.125 MG PO TABS
3.1250 mg | ORAL_TABLET | Freq: Two times a day (BID) | ORAL | Status: DC
  Filled 2013-07-08 (×3): qty 1

## 2013-07-08 MED ORDER — HYDRALAZINE HCL 20 MG/ML IJ SOLN
INTRAMUSCULAR | Status: AC
  Filled 2013-07-08: qty 1

## 2013-07-08 MED ORDER — MORPHINE SULFATE 2 MG/ML IJ SOLN: 1.0000 mg | INTRAMUSCULAR | Status: DC | PRN

## 2013-07-08 MED ORDER — SODIUM CHLORIDE 0.9 % IV SOLN
INTRAVENOUS | Status: DC
  Administered 2013-07-08: 02:00:00 via INTRAVENOUS

## 2013-07-08 MED ORDER — INSULIN ASPART 100 UNIT/ML ~~LOC~~ SOLN
0.0000 [IU] | SUBCUTANEOUS | Status: DC
  Administered 2013-07-08: 2 [IU] via SUBCUTANEOUS
  Administered 2013-07-08 – 2013-07-09 (×5): 3 [IU] via SUBCUTANEOUS
  Administered 2013-07-09: 2 [IU] via SUBCUTANEOUS
  Administered 2013-07-09 (×3): 3 [IU] via SUBCUTANEOUS
  Administered 2013-07-09 – 2013-07-10 (×3): 2 [IU] via SUBCUTANEOUS
  Administered 2013-07-10 (×2): 3 [IU] via SUBCUTANEOUS
  Administered 2013-07-10 (×2): 2 [IU] via SUBCUTANEOUS
  Administered 2013-07-10 – 2013-07-11 (×2): 3 [IU] via SUBCUTANEOUS
  Administered 2013-07-11 (×3): 2 [IU] via SUBCUTANEOUS
  Administered 2013-07-11 – 2013-07-12 (×8): 3 [IU] via SUBCUTANEOUS
  Administered 2013-07-13 (×2): 5 [IU] via SUBCUTANEOUS
  Administered 2013-07-13: 3 [IU] via SUBCUTANEOUS
  Administered 2013-07-13: 8 [IU] via SUBCUTANEOUS
  Administered 2013-07-13 – 2013-07-14 (×2): 3 [IU] via SUBCUTANEOUS
  Administered 2013-07-14 (×2): 5 [IU] via SUBCUTANEOUS
  Administered 2013-07-14: 8 [IU] via SUBCUTANEOUS
  Administered 2013-07-14 (×2): 5 [IU] via SUBCUTANEOUS
  Administered 2013-07-15 (×3): 3 [IU] via SUBCUTANEOUS
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
## 2013-07-08 NOTE — Anesthesia Postprocedure Evaluation (Signed)
Anesthesia Post Note  Patient: Frederick Carr  Procedure(s) Performed: Procedure(s) (LRB): Craniectomy for Subdural and Epidural Hematoma with Cranial Flap in the Abdomen (N/A)  Anesthesia type: General  Patient location: ICU  Post pain: Pain level controlled  Post assessment: Post-op Vital signs reviewed  Last Vitals:  Filed Vitals:   07/08/13 0145  BP: 182/95  Pulse: 70  Temp:   Resp: 15    Post vital signs: stable  Level of consciousness: Patient remains intubated per anesthesia plan  Complications: No apparent anesthesia complications
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
## 2013-07-08 NOTE — Transfer of Care (Signed)
Immediate Anesthesia Transfer of Care Note  Patient: Nikhil Behrman  Procedure(s) Performed: Procedure(s) with comments: Craniectomy for Subdural and Epidural Hematoma with Cranial Flap in the Abdomen (N/A) - Craniectomy for Subdural and Epidural Hematoma with Cranial Flap in the Abdomen  Patient Location: NICU  Anesthesia Type:General  Level of Consciousness: Patient remains intubated per anesthesia plan  Airway & Oxygen Therapy: Patient remains intubated per anesthesia plan and Patient placed on Ventilator (see vital sign flow sheet for setting)  Post-op Assessment: Report given to PACU RN and Post -op Vital signs reviewed and stable  Post vital signs: Reviewed and stable  Complications: No apparent anesthesia complications

## 2013-07-08 NOTE — Progress Notes (Signed)
Pt had 4 beat run of Vtach... Notified Dr. Molli Knock.  No new orders. Pt. Remains stable.. Will continue to monitor.

## 2013-07-09 ENCOUNTER — Inpatient Hospital Stay (HOSPITAL_COMMUNITY): Payer: Medicare Other

## 2013-07-09 DIAGNOSIS — I6200 Nontraumatic subdural hemorrhage, unspecified: Secondary | ICD-10-CM

## 2013-07-09 DIAGNOSIS — N179 Acute kidney failure, unspecified: Secondary | ICD-10-CM

## 2013-07-09 DIAGNOSIS — J9600 Acute respiratory failure, unspecified whether with hypoxia or hypercapnia: Secondary | ICD-10-CM

## 2013-07-09 LAB — GLUCOSE, CAPILLARY
Glucose-Capillary: 164 mg/dL — ABNORMAL HIGH (ref 70–99)
Glucose-Capillary: 146 mg/dL — ABNORMAL HIGH (ref 70–99)
Glucose-Capillary: 146 mg/dL — ABNORMAL HIGH (ref 70–99)

## 2013-07-09 LAB — BASIC METABOLIC PANEL
BUN: 23 mg/dL (ref 6–23)
CO2: 22 mEq/L (ref 19–32)
Chloride: 107 mEq/L (ref 96–112)
Creatinine, Ser: 1.4 mg/dL — ABNORMAL HIGH (ref 0.50–1.35)
GFR calc non Af Amer: 46 mL/min — ABNORMAL LOW (ref >90)
Glucose, Bld: 177 mg/dL — ABNORMAL HIGH (ref 70–99)
Sodium: 140 mEq/L (ref 135–145)

## 2013-07-09 LAB — CBC
HCT: 29.4 % — ABNORMAL LOW (ref 39.0–52.0)
MCV: 89.1 fL (ref 78.0–100.0)
RBC: 3.3 MIL/uL — ABNORMAL LOW (ref 4.22–5.81)
WBC: 14.8 10*3/uL — ABNORMAL HIGH (ref 4.0–10.5)

## 2013-07-09 LAB — BLOOD GAS, ARTERIAL
O2 Saturation: 99.3 %
PEEP: 5 cmH2O
RATE: 14 resp/min

## 2013-07-09 LAB — PHOSPHORUS: Phosphorus: 1.6 mg/dL — ABNORMAL LOW (ref 2.3–4.6)

## 2013-07-09 MED ORDER — ADULT MULTIVITAMIN W/MINERALS CH
1.0000 | ORAL_TABLET | Freq: Every day | ORAL | Status: DC
  Administered 2013-07-09 – 2013-07-14 (×6): 1
  Filled 2013-07-09 (×8): qty 1

## 2013-07-09 MED ORDER — SODIUM PHOSPHATE 3 MMOLE/ML IV SOLN
30.0000 mmol | Freq: Once | INTRAVENOUS | Status: AC
  Administered 2013-07-09: 30 mmol via INTRAVENOUS
  Filled 2013-07-09: qty 10

## 2013-07-09 MED ORDER — ACETAMINOPHEN 160 MG/5ML PO SOLN
650.0000 mg | Freq: Four times a day (QID) | ORAL | Status: DC | PRN
  Administered 2013-07-09 – 2013-07-15 (×8): 650 mg
  Filled 2013-07-09 (×8): qty 20.3

## 2013-07-09 MED ORDER — PANTOPRAZOLE SODIUM 40 MG PO PACK
40.0000 mg | PACK | Freq: Every day | ORAL | Status: DC
  Administered 2013-07-09 – 2013-07-14 (×6): 40 mg
  Filled 2013-07-09 (×7): qty 20

## 2013-07-09 MED ORDER — PIVOT 1.5 CAL PO LIQD
1000.0000 mL | ORAL | Status: DC
  Administered 2013-07-09 – 2013-07-14 (×4): 1000 mL
  Filled 2013-07-09 (×8): qty 1000

## 2013-07-09 MED ORDER — PRO-STAT SUGAR FREE PO LIQD
30.0000 mL | Freq: Every day | ORAL | Status: DC
  Administered 2013-07-09 – 2013-07-15 (×29): 30 mL
  Filled 2013-07-09 (×34): qty 30
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
## 2013-07-09 NOTE — Progress Notes (Signed)
UR completed.  Raneisha Bress, RN BSN [facility] CCM Trauma/Neuro ICU Case Manager [phone]  

## 2013-07-09 NOTE — Progress Notes (Signed)
eLink Physician-Brief Progress Note Patient Name: Frederick Carr DOB: 14-Oct-1931 MRN: [id number]  Date of Service  07/09/2013   HPI/Events of Note  Hypophosphatemia   eICU Interventions  Phos replaced   Intervention Category Intermediate Interventions: Electrolyte abnormality - evaluation and management  DETERDING,ELIZABETH 07/09/2013, 6:50 AM

## 2013-07-10 ENCOUNTER — Inpatient Hospital Stay (HOSPITAL_COMMUNITY): Payer: Medicare Other

## 2013-07-10 DIAGNOSIS — E1159 Type 2 diabetes mellitus with other circulatory complications: Secondary | ICD-10-CM

## 2013-07-10 DIAGNOSIS — I509 Heart failure, unspecified: Secondary | ICD-10-CM

## 2013-07-10 DIAGNOSIS — I10 Essential (primary) hypertension: Secondary | ICD-10-CM

## 2013-07-10 DIAGNOSIS — G912 (Idiopathic) normal pressure hydrocephalus: Secondary | ICD-10-CM

## 2013-07-10 LAB — BASIC METABOLIC PANEL
CO2: 22 mEq/L (ref 19–32)
Chloride: 108 mEq/L (ref 96–112)
GFR calc Af Amer: 55 mL/min — ABNORMAL LOW (ref >90)
Glucose, Bld: 165 mg/dL — ABNORMAL HIGH (ref 70–99)
Potassium: 3.7 mEq/L (ref 3.5–5.1)
Sodium: 141 mEq/L (ref 135–145)

## 2013-07-10 LAB — GLUCOSE, CAPILLARY
Glucose-Capillary: 161 mg/dL — ABNORMAL HIGH (ref 70–99)
Glucose-Capillary: 154 mg/dL — ABNORMAL HIGH (ref 70–99)
Glucose-Capillary: 146 mg/dL — ABNORMAL HIGH (ref 70–99)
Glucose-Capillary: 152 mg/dL — ABNORMAL HIGH (ref 70–99)

## 2013-07-10 LAB — CBC
HCT: 27 % — ABNORMAL LOW (ref 39.0–52.0)
Hemoglobin: 9.2 g/dL — ABNORMAL LOW (ref 13.0–17.0)
MCH: 31.1 pg (ref 26.0–34.0)
MCV: 91.2 fL (ref 78.0–100.0)
Platelets: 169 10*3/uL (ref 150–400)
RBC: 2.96 MIL/uL — ABNORMAL LOW (ref 4.22–5.81)
WBC: 13 10*3/uL — ABNORMAL HIGH (ref 4.0–10.5)

## 2013-07-10 MED ORDER — BIOTENE DRY MOUTH MT LIQD
15.0000 mL | Freq: Four times a day (QID) | OROMUCOSAL | Status: DC
  Administered 2013-07-11 – 2013-07-15 (×18): 15 mL via OROMUCOSAL

## 2013-07-10 MED ORDER — CHLORHEXIDINE GLUCONATE 0.12 % MT SOLN
15.0000 mL | Freq: Two times a day (BID) | OROMUCOSAL | Status: DC
  Administered 2013-07-11 – 2013-07-15 (×9): 15 mL via OROMUCOSAL
  Filled 2013-07-10 (×8): qty 15

## 2013-07-10 NOTE — Progress Notes (Signed)
Left arm and left eye twitching , last approx.20 seconds .
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
## 2013-07-10 NOTE — Progress Notes (Signed)
Subjective: Patient reports Intubated and unresponsive. Patient had some focal seizures on left side noted recently.  Objective: Vital signs in last 24 hours: Temp:  [98.5 F (36.9 C)-101.5 F (38.6 C)] 99.6 F (37.6 C) (11/29 0300) Pulse Rate:  [25-85] 81 (11/29 0800) Resp:  [12-33] 28 (11/29 0800) BP: (116-155)/(46-74) 137/73 mmHg (11/29 0800) SpO2:  [100 %] 100 % (11/29 0800) Arterial Line BP: (170-181)/(62-81) 173/66 mmHg (11/28 1300) FiO2 (%):  [30 %] 30 % (11/29 0726) Weight:  [95 kg (209 lb 7 oz)] 95 kg (209 lb 7 oz) (11/29 0400)  Intake/Output from previous day: 11/28 0701 - 11/29 0700 In: 1108 [NG/GT:630; IV Piggyback:468] Out: 1785 [Urine:1785] Intake/Output this shift: Total I/O In: 30 [NG/GT:30] Out: 60 [Urine:60]  Pupils 3 mm and reactive. Hemovac drain with minimal output (10 cc)  Lab Results:  Recent Labs  07/09/13 0515 07/10/13 0406  WBC 14.8* 13.0*  HGB 10.4* 9.2*  HCT 29.4* 27.0*  PLT 191 169   BMET  Recent Labs  07/09/13 0515 07/10/13 0406  NA 140 141  K 4.1 3.7  CL 107 108  CO2 22 22  GLUCOSE 177* 165*  BUN 23 31*  CREATININE 1.40* 1.38*  CALCIUM 8.5 8.6    Studies/Results: Ct Head Wo Contrast  07/09/2013   CLINICAL DATA:  Subdural hemorrhage, status post craniectomy  EXAM: CT HEAD WITHOUT CONTRAST  TECHNIQUE: Contiguous axial images were obtained from the base of the skull through the vertex without intravenous contrast.  COMPARISON:  06/30/2013  FINDINGS: The bony calvarium now and demonstrates a large craniectomy on the right involving the frontal and parietal region. A surgical drain is noted on the right. Air is noted within the surgical bed. There remains a subdural hemorrhage component bilaterally. This is roughly similar to that seen on the prior exam. The previously noted epidural hemorrhage on the right has been surgically evacuated. There is a fair amount of edema identified in the right cerebral hemisphere. Midline shift of 8  mm is noted. A ventriculostomy catheter is noted on the left. No new focal hemorrhage is seen.  IMPRESSION: Postoperative changes with evacuation of the previously seen epidural hematoma.  Persistent subdural hemorrhage is noted. The degree of mass effect is prominent from right the left and appears slightly greater than that seen on the prior exam. Considerable edema within the right cerebral hemisphere is noted.   Electronically Signed   By: Alcide Clever M.D.   On: 07/09/2013 09:51   Dg Chest Port 1 View  07/10/2013   CLINICAL DATA:  Intubation.  EXAM: PORTABLE CHEST - 1 VIEW  COMPARISON:  07/09/2013.  FINDINGS: Endotracheal tube tip 6 cm above the carina. NG tube coiled in stomach. Prior CABG. Stable cardiomegaly. No pulmonary venous congestion. Cardiac pacer in stable position. Mild bibasilar atelectasis remains. No pleural effusion or pneumothorax.  IMPRESSION: 1. Stable support lines.  Endotracheal tube 6 cm above the carina. 2. Prior CABG. Cardiomegaly. No pulmonary venous congestion. Cardiac pacer noted in stable position. 3. Persistent bibasilar atelectasis versus infiltrates.   Electronically Signed   By: Maisie Fus  Register   On: 07/10/2013 08:14   Dg Chest Port 1 View  07/09/2013   CLINICAL DATA:  Endotracheal tube.  EXAM: PORTABLE CHEST - 1 VIEW  COMPARISON:  07/08/2013.  FINDINGS: Endotracheal tube and enteric tube in stable position . Prior CABG. Cardiac pacer in stable position. Cardiomegaly. No pulmonary venous congestion. Minimal subsegmental atelectasis lung bases. Poor lung volumes. No pleural effusion or pneumothorax.  IMPRESSION: 1. Stable support tubes. 2. Prior CABG. Cardiac pacer. Cardiomegaly. No pulmonary venous congestion. 3. Poor lung volumes with stable bibasilar subsegmental atelectasis.   Electronically Signed   By: Maisie Fus  Register   On: 07/09/2013 07:41    Assessment/Plan: The CT scan from yesterday demonstrates increased mass effect of right parietal and temporal lobes.  This causes some increased shift of 8 mm. The subdural hematomas evacuated, a large craniectomy defect exists. I do not see a role for further surgery at this time. Patient has a ventriculoperitoneal shunt on the opposite side. Laceration over the left frontal region is noted.  LOS: 3 days  Continue observation. Critical edema is noted in the right hemisphere.   Suleyman Ehrman J 07/10/2013, 9:57 AM

## 2013-07-11 ENCOUNTER — Inpatient Hospital Stay (HOSPITAL_COMMUNITY): Payer: Medicare Other

## 2013-07-11 LAB — GLUCOSE, CAPILLARY
Glucose-Capillary: 151 mg/dL — ABNORMAL HIGH (ref 70–99)
Glucose-Capillary: 162 mg/dL — ABNORMAL HIGH (ref 70–99)
Glucose-Capillary: 149 mg/dL — ABNORMAL HIGH (ref 70–99)
Glucose-Capillary: 139 mg/dL — ABNORMAL HIGH (ref 70–99)
Glucose-Capillary: 151 mg/dL — ABNORMAL HIGH (ref 70–99)

## 2013-07-11 LAB — BLOOD GAS, ARTERIAL
Bicarbonate: 22.6 mEq/L (ref 20.0–24.0)
Drawn by: 155271
MECHVT: 500 mL
O2 Saturation: 98.6 %
PEEP: 5 cmH2O
Patient temperature: 98.6
RATE: 14 resp/min
pCO2 arterial: 30.5 mmHg — ABNORMAL LOW (ref 35.0–45.0)
pH, Arterial: 7.482 — ABNORMAL HIGH (ref 7.350–7.450)

## 2013-07-11 LAB — BASIC METABOLIC PANEL
BUN: 41 mg/dL — ABNORMAL HIGH (ref 6–23)
CO2: 22 mEq/L (ref 19–32)
Calcium: 8.6 mg/dL (ref 8.4–10.5)
Creatinine, Ser: 1.33 mg/dL (ref 0.50–1.35)
GFR calc non Af Amer: 49 mL/min — ABNORMAL LOW (ref >90)
Glucose, Bld: 175 mg/dL — ABNORMAL HIGH (ref 70–99)

## 2013-07-11 LAB — MAGNESIUM: Magnesium: 2.5 mg/dL (ref 1.5–2.5)

## 2013-07-11 LAB — CBC
MCH: 30.8 pg (ref 26.0–34.0)
MCHC: 34.3 g/dL (ref 30.0–36.0)
MCV: 89.8 fL (ref 78.0–100.0)
Platelets: 165 10*3/uL (ref 150–400)
RBC: 2.95 MIL/uL — ABNORMAL LOW (ref 4.22–5.81)

## 2013-07-11 NOTE — Progress Notes (Signed)
Patient ID: Frederick Carr, male   DOB: [birth year], 79 y.o.   MRN: [id number] Dressing changed, drain from subgaleal space removed. Minimal drainage has been noted. Air in line noted. Scalp flap appears uniformly depressed and sunken. Clinically patient's neuro exam remained severely depressed. Continues supportive care.
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
## 2013-07-12 ENCOUNTER — Inpatient Hospital Stay (HOSPITAL_COMMUNITY): Payer: Medicare Other

## 2013-07-12 DIAGNOSIS — I25810 Atherosclerosis of coronary artery bypass graft(s) without angina pectoris: Secondary | ICD-10-CM

## 2013-07-12 LAB — CBC
HCT: 27.8 % — ABNORMAL LOW (ref 39.0–52.0)
Hemoglobin: 9.6 g/dL — ABNORMAL LOW (ref 13.0–17.0)
MCV: 90 fL (ref 78.0–100.0)
RBC: 3.09 MIL/uL — ABNORMAL LOW (ref 4.22–5.81)
WBC: 9.3 10*3/uL (ref 4.0–10.5)

## 2013-07-12 LAB — BLOOD GAS, ARTERIAL
Acid-Base Excess: 0.2 mmol/L (ref 0.0–2.0)
Bicarbonate: 22.8 mEq/L (ref 20.0–24.0)
Drawn by: 155671
FIO2: 0.3 %
O2 Saturation: 99.4 %
PEEP: 5 cmH2O
Patient temperature: 99.4
RATE: 14 resp/min
TCO2: 23.7 mmol/L (ref 0–100)
pH, Arterial: 7.513 — ABNORMAL HIGH (ref 7.350–7.450)
pO2, Arterial: 122 mmHg — ABNORMAL HIGH (ref 80.0–100.0)

## 2013-07-12 LAB — GLUCOSE, CAPILLARY
Glucose-Capillary: 156 mg/dL — ABNORMAL HIGH (ref 70–99)
Glucose-Capillary: 194 mg/dL — ABNORMAL HIGH (ref 70–99)
Glucose-Capillary: 177 mg/dL — ABNORMAL HIGH (ref 70–99)
Glucose-Capillary: 195 mg/dL — ABNORMAL HIGH (ref 70–99)

## 2013-07-12 LAB — BASIC METABOLIC PANEL
BUN: 46 mg/dL — ABNORMAL HIGH (ref 6–23)
CO2: 23 mEq/L (ref 19–32)
Chloride: 112 mEq/L (ref 96–112)
Creatinine, Ser: 1.2 mg/dL (ref 0.50–1.35)
GFR calc non Af Amer: 56 mL/min — ABNORMAL LOW (ref >90)
Glucose, Bld: 204 mg/dL — ABNORMAL HIGH (ref 70–99)
Potassium: 3.5 mEq/L (ref 3.5–5.1)

## 2013-07-12 LAB — MAGNESIUM: Magnesium: 3 mg/dL — ABNORMAL HIGH (ref 1.5–2.5)

## 2013-07-12 LAB — PHOSPHORUS: Phosphorus: 2.2 mg/dL — ABNORMAL LOW (ref 2.3–4.6)

## 2013-07-12 LAB — SODIUM: Sodium: 146 mEq/L — ABNORMAL HIGH (ref 135–145)

## 2013-07-12 MED ORDER — SODIUM PHOSPHATE 3 MMOLE/ML IV SOLN
30.0000 mmol | Freq: Once | INTRAVENOUS | Status: AC
  Administered 2013-07-12: 30 mmol via INTRAVENOUS
  Filled 2013-07-12: qty 10

## 2013-07-12 MED ORDER — SODIUM CHLORIDE 3 % IV SOLN
INTRAVENOUS | Status: DC
  Administered 2013-07-12 (×2): via INTRAVENOUS
  Filled 2013-07-12 (×9): qty 500

## 2013-07-12 MED ORDER — FENTANYL CITRATE 0.05 MG/ML IJ SOLN
100.0000 ug | INTRAMUSCULAR | Status: DC | PRN
  Administered 2013-07-12 – 2013-07-15 (×9): 100 ug via INTRAVENOUS
  Filled 2013-07-12 (×8): qty 2

## 2013-07-12 MED ORDER — FENTANYL CITRATE 0.05 MG/ML IJ SOLN
INTRAMUSCULAR | Status: AC
  Administered 2013-07-12: 100 ug via INTRAVENOUS
  Filled 2013-07-12: qty 2

## 2013-07-12 MED ORDER — MAGNESIUM SULFATE 40 MG/ML IJ SOLN
2.0000 g | Freq: Once | INTRAMUSCULAR | Status: AC
  Administered 2013-07-12: 2 g via INTRAVENOUS
  Filled 2013-07-12: qty 50

## 2013-07-12 NOTE — Procedures (Signed)
Central Venous Catheter Insertion Procedure Note Hamish Banks [id number] Aug 11, 1932  Procedure: Insertion of Central Venous Catheter Indications: 3%  Procedure Details Consent: Risks of procedure as well as the alternatives and risks of each were explained to the (patient/caregiver).  Consent for procedure obtained. Time Out: Verified patient identification, verified procedure, site/side was marked, verified correct patient position, special equipment/implants available, medications/allergies/relevent history reviewed, required imaging and test results available.  Performed  Maximum sterile technique was used including antiseptics, cap, gloves, gown, hand hygiene, mask and sheet. Skin prep: Chlorhexidine; local anesthetic administered A antimicrobial bonded/coated triple lumen catheter was placed in the right femoral vein collar neck.  Evaluation Blood flow good Complications: No apparent complications Patient did tolerate procedure well. Chest X-ray ordered to verify placement.  CXR: not needed.  Nelda Bucks 07/12/2013, 5:35 PM  [place] [employer]  Mcarthur Rossetti. Tyson Alias, MD, FACP Pgr: [phone] [place] [facility] & [facility]
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
## 2013-07-12 NOTE — Progress Notes (Signed)
Dr. Conchita Paris in IR procedure. Notified the staff in IR of CT results and they will notify Dr. Conchita Paris and call with any new orders. Will continue to monitor.

## 2013-07-12 NOTE — Progress Notes (Signed)
eLink Physician-Brief Progress Note Patient Name: Frederick Carr DOB: [birth year] MRN: [id number]  Date of Service  07/12/2013   HPI/Events of Note  Hypophosphatemia  eICU Interventions  Phos replaced   Intervention Category Intermediate Interventions: Electrolyte abnormality - evaluation and management  [name] 07/12/2013, 6:16 AM

## 2013-07-12 NOTE — Progress Notes (Signed)
Pt remains intubated, unresponsive  CTH reviewed, demonstrating worsening right hemispheric edema, trans-tentorial herniation, and right ganglionic and likely right frontal stroke. MLS now increased to >1cm  PLAN: - CVC by ICU for initiation of 3% hypertonic saline at 100cc/hr - Monitor serum Na per protocol, goal 150-155 mEq/L  I spoke with the patient's POA, his son regarding the new CT findings, and likely poor prognosis. I told him we would start the hypertonic treatment and continue to support for the next few days, but should there be no improvement, we could discuss the patient's wishes for long-term care. His son said the patient has a living will and will fax it to the ICU tomorrow am. All his questions were answered.

## 2013-07-12 DEATH — deceased

## 2013-07-13 ENCOUNTER — Inpatient Hospital Stay (HOSPITAL_COMMUNITY): Payer: Medicare Other

## 2013-07-13 ENCOUNTER — Encounter (HOSPITAL_COMMUNITY): Payer: Self-pay | Admitting: Neurosurgery

## 2013-07-13 DIAGNOSIS — Z95810 Presence of automatic (implantable) cardiac defibrillator: Secondary | ICD-10-CM

## 2013-07-13 DIAGNOSIS — I5020 Unspecified systolic (congestive) heart failure: Secondary | ICD-10-CM

## 2013-07-13 DIAGNOSIS — I472 Ventricular tachycardia: Secondary | ICD-10-CM

## 2013-07-13 LAB — GLUCOSE, CAPILLARY
Glucose-Capillary: 186 mg/dL — ABNORMAL HIGH (ref 70–99)
Glucose-Capillary: 217 mg/dL — ABNORMAL HIGH (ref 70–99)
Glucose-Capillary: 264 mg/dL — ABNORMAL HIGH (ref 70–99)
Glucose-Capillary: 224 mg/dL — ABNORMAL HIGH (ref 70–99)

## 2013-07-13 LAB — MAGNESIUM: Magnesium: 2.8 mg/dL — ABNORMAL HIGH (ref 1.5–2.5)

## 2013-07-13 LAB — BASIC METABOLIC PANEL
BUN: 47 mg/dL — ABNORMAL HIGH (ref 6–23)
Calcium: 8.7 mg/dL (ref 8.4–10.5)
Creatinine, Ser: 1.22 mg/dL (ref 0.50–1.35)
GFR calc non Af Amer: 55 mL/min — ABNORMAL LOW (ref >90)
Glucose, Bld: 235 mg/dL — ABNORMAL HIGH (ref 70–99)
Sodium: 155 mEq/L — ABNORMAL HIGH (ref 135–145)

## 2013-07-13 LAB — CBC
HCT: 31.6 % — ABNORMAL LOW (ref 39.0–52.0)
MCHC: 31.3 g/dL (ref 30.0–36.0)
MCV: 88.3 fL (ref 78.0–100.0)
RDW: 14.5 % (ref 11.5–15.5)

## 2013-07-13 LAB — BLOOD GAS, ARTERIAL
Bicarbonate: 22.2 mEq/L (ref 20.0–24.0)
PEEP: 5 cmH2O
Patient temperature: 98.6
pH, Arterial: 7.502 — ABNORMAL HIGH (ref 7.350–7.450)
pO2, Arterial: 92.5 mmHg (ref 80.0–100.0)

## 2013-07-13 MED ORDER — POTASSIUM PHOSPHATE DIBASIC 3 MMOLE/ML IV SOLN
30.0000 mmol | Freq: Once | INTRAVENOUS | Status: AC
  Administered 2013-07-13: 30 mmol via INTRAVENOUS
  Filled 2013-07-13 (×2): qty 10

## 2013-07-13 MED ORDER — AMIODARONE HCL IN DEXTROSE 360-4.14 MG/200ML-% IV SOLN
30.0000 mg/h | INTRAVENOUS | Status: DC
  Administered 2013-07-14 – 2013-07-15 (×3): 30 mg/h via INTRAVENOUS
  Filled 2013-07-13 (×9): qty 200

## 2013-07-13 MED ORDER — AMIODARONE LOAD VIA INFUSION
150.0000 mg | Freq: Once | INTRAVENOUS | Status: AC
  Administered 2013-07-13: 150 mg via INTRAVENOUS
  Filled 2013-07-13 (×2): qty 83.34

## 2013-07-13 MED ORDER — AMIODARONE HCL IN DEXTROSE 360-4.14 MG/200ML-% IV SOLN
60.0000 mg/h | INTRAVENOUS | Status: AC
  Administered 2013-07-13 (×2): 60 mg/h via INTRAVENOUS
  Filled 2013-07-13 (×4): qty 200

## 2013-07-13 NOTE — Progress Notes (Signed)
Na level is 156. Stopped 3%. Notified Dr. Patric Dykes. Will continue to monitor.

## 2013-07-13 NOTE — Progress Notes (Signed)
Pt went into Vtach at 08:41 and 08:47 lasting 11 seconds each before the pt's ICD fired. ICD fired and pt went back into NS with 1st degree heart block. Rahul, PA student on unit. Notified Dr. Molli Knock; STAT EKG and Troponin ordered. Strips printed out and placed in chart. Will continue to monitor.

## 2013-07-13 NOTE — Progress Notes (Signed)
CRITICAL VALUE ALERT  Troponin 0.55  Critical value received:  0944  Date of notification:  07/13/13  Time of notification:  0944  Critical value read back:yes  Nurse who received alert:  Zenovia Jordan, RN   MD notified (1st page):  Dr. Molli Knock (on unit)  Time of first page:  0945  MD notified (2nd page):  Time of second page:  Responding MD:  Dr. Molli Knock  Time MD responded:  0945 (on unit)
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
## 2013-07-13 NOTE — Progress Notes (Signed)
Pt's Na is 155. Notified Dr. Conchita Paris; orders to decrease 3% down to 39mL/hr. Will continue to monitor.
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
## 2013-07-13 NOTE — Progress Notes (Signed)
At 1021 the patient went into vtach for 12 seconds until the ICD fired. Pt converted to SR with first degree block. Dr. Molli Knock on unit and notified. Pt's neuro status remains the same. Will continue to monitor.

## 2013-07-14 ENCOUNTER — Inpatient Hospital Stay (HOSPITAL_COMMUNITY): Payer: Medicare Other

## 2013-07-14 DIAGNOSIS — I472 Ventricular tachycardia: Secondary | ICD-10-CM

## 2013-07-14 LAB — BASIC METABOLIC PANEL
BUN: 53 mg/dL — ABNORMAL HIGH (ref 6–23)
BUN: 55 mg/dL — ABNORMAL HIGH (ref 6–23)
CO2: 20 mEq/L (ref 19–32)
CO2: 21 mEq/L (ref 19–32)
Calcium: 8.5 mg/dL (ref 8.4–10.5)
Chloride: 126 mEq/L — ABNORMAL HIGH (ref 96–112)
Creatinine, Ser: 1.2 mg/dL (ref 0.50–1.35)
GFR calc Af Amer: 65 mL/min — ABNORMAL LOW (ref >90)
GFR calc non Af Amer: 56 mL/min — ABNORMAL LOW (ref >90)
GFR calc non Af Amer: 47 mL/min — ABNORMAL LOW (ref >90)
Glucose, Bld: 198 mg/dL — ABNORMAL HIGH (ref 70–99)
Potassium: 3.5 mEq/L (ref 3.5–5.1)
Sodium: 158 mEq/L — ABNORMAL HIGH (ref 135–145)

## 2013-07-14 LAB — GLUCOSE, CAPILLARY
Glucose-Capillary: 204 mg/dL — ABNORMAL HIGH (ref 70–99)
Glucose-Capillary: 202 mg/dL — ABNORMAL HIGH (ref 70–99)
Glucose-Capillary: 201 mg/dL — ABNORMAL HIGH (ref 70–99)
Glucose-Capillary: 252 mg/dL — ABNORMAL HIGH (ref 70–99)
Glucose-Capillary: 190 mg/dL — ABNORMAL HIGH (ref 70–99)

## 2013-07-14 LAB — SODIUM
Sodium: 160 mEq/L — ABNORMAL HIGH (ref 135–145)
Sodium: 157 mEq/L — ABNORMAL HIGH (ref 135–145)

## 2013-07-14 LAB — CBC
MCHC: 32.6 g/dL (ref 30.0–36.0)
MCV: 93 fL (ref 78.0–100.0)
Platelets: 264 10*3/uL (ref 150–400)
RBC: 3 MIL/uL — ABNORMAL LOW (ref 4.22–5.81)
RDW: 14.3 % (ref 11.5–15.5)
WBC: 10 10*3/uL (ref 4.0–10.5)

## 2013-07-14 LAB — PHOSPHORUS: Phosphorus: 2.4 mg/dL (ref 2.3–4.6)

## 2013-07-14 MED ORDER — FREE WATER
200.0000 mL | Freq: Three times a day (TID) | Status: DC
  Administered 2013-07-14 – 2013-07-15 (×3): 200 mL

## 2013-07-14 NOTE — Progress Notes (Signed)
UR completed at request of insurance provider.   Abbygael Curtiss, RN BSN MHA CCM Trauma/Neuro ICU Case Manager [phone]  
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
## 2013-07-14 NOTE — Progress Notes (Signed)
Received a call from [employer] regarding lab results. Results placed in paper chart for physician to review. Will continue to monitor.
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
## 2013-07-15 ENCOUNTER — Other Ambulatory Visit: Payer: Self-pay | Admitting: *Deleted

## 2013-07-15 ENCOUNTER — Inpatient Hospital Stay (HOSPITAL_COMMUNITY): Payer: Medicare Other

## 2013-07-15 LAB — BASIC METABOLIC PANEL
CO2: 20 mEq/L (ref 19–32)
Chloride: 126 mEq/L — ABNORMAL HIGH (ref 96–112)
Creatinine, Ser: 1.47 mg/dL — ABNORMAL HIGH (ref 0.50–1.35)
GFR calc Af Amer: 51 mL/min — ABNORMAL LOW (ref >90)
Sodium: 158 mEq/L — ABNORMAL HIGH (ref 135–145)

## 2013-07-15 LAB — CBC
HCT: 28.5 % — ABNORMAL LOW (ref 39.0–52.0)
MCHC: 32.3 g/dL (ref 30.0–36.0)
Platelets: 267 10*3/uL (ref 150–400)
RBC: 3.05 MIL/uL — ABNORMAL LOW (ref 4.22–5.81)
RDW: 14.6 % (ref 11.5–15.5)
WBC: 11.4 10*3/uL — ABNORMAL HIGH (ref 4.0–10.5)

## 2013-07-15 LAB — BLOOD GAS, ARTERIAL
Acid-base deficit: 1.1 mmol/L (ref 0.0–2.0)
Bicarbonate: 21.1 mEq/L (ref 20.0–24.0)
Drawn by: 10006
MECHVT: 500 mL
PEEP: 5 cmH2O
Patient temperature: 98.6
TCO2: 21.9 mmol/L (ref 0–100)
pCO2 arterial: 24 mmHg — ABNORMAL LOW (ref 35.0–45.0)
pH, Arterial: 7.553 — ABNORMAL HIGH (ref 7.350–7.450)
pO2, Arterial: 74.4 mmHg — ABNORMAL LOW (ref 80.0–100.0)

## 2013-07-15 LAB — GLUCOSE, CAPILLARY
Glucose-Capillary: 192 mg/dL — ABNORMAL HIGH (ref 70–99)
Glucose-Capillary: 198 mg/dL — ABNORMAL HIGH (ref 70–99)

## 2013-07-15 LAB — SODIUM: Sodium: 159 mEq/L — ABNORMAL HIGH (ref 135–145)

## 2013-07-15 LAB — MAGNESIUM: Magnesium: 2.9 mg/dL — ABNORMAL HIGH (ref 1.5–2.5)

## 2013-07-15 LAB — PHOSPHORUS: Phosphorus: 2.2 mg/dL — ABNORMAL LOW (ref 2.3–4.6)

## 2013-07-15 MED ORDER — SODIUM CHLORIDE 0.9 % IV SOLN
10.0000 mg/h | INTRAVENOUS | Status: DC
  Administered 2013-07-15: 10 mg/h via INTRAVENOUS
  Administered 2013-07-15: 16 mg/h via INTRAVENOUS
  Filled 2013-07-15 (×3): qty 10

## 2013-07-15 MED ORDER — MORPHINE SULFATE 25 MG/ML IV SOLN
10.0000 mg/h | INTRAVENOUS | Status: DC
  Filled 2013-07-15: qty 10

## 2013-07-15 MED ORDER — MORPHINE BOLUS VIA INFUSION
5.0000 mg | INTRAVENOUS | Status: DC | PRN
  Filled 2013-07-15: qty 20
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
## 2013-08-12 NOTE — Progress Notes (Signed)
CDS was called 1410 to give cardiac TOD.  Ara Kussmaul, with CDS, said pt may be a candidate for tissue donation but not eyes.  Reference #  [id number]

## 2013-08-12 NOTE — Progress Notes (Signed)
Chaplain Note:   I met and provided emotional and spiritual support to the two sons and their wives of pt. The family were experiencing normal grief over the impending death of their father. They expressed affection for him and his impact in their lives. They also spoke of his wishes not to be kept alive in his current situation and though emotionally difficult they were confident that withdrawing life support was what he would have wanted.    The children are from [place] and [place]. They are affiliated with the [employer]. I am an [place] priest and so they asked for the liturgy for the dying for their father which I offered for him.   They appeared to be a very close family and were supportive of each other.   They also expressed appreciation for the care of the staff for the their father and for them.   Kathlyn Sacramento  Director [employer] [phone]

## 2013-08-12 NOTE — Progress Notes (Addendum)
Pt was terminally extubated around 1115.  Cardiac time of death was 1343. Heart sounds were not heard via auscultation by Butch Penny, RN and Pieter Partridge, RN.  ME, Al Decant, was notified and pt is an ME case.  All lines are to be left in pt before going to the morgue. Dr. Conchita Paris was notified of TOD.

## 2013-08-12 NOTE — Progress Notes (Signed)
Respiratory therapy note-Patient was extubated per withdrawal orders per family and patient wishes.

## 2013-08-12 NOTE — Progress Notes (Signed)
I spoke with the patient's family at bedside this am RE poor prognosis given lack of response to aggressive maximal surgical and medical management. According to the patient's wishes, the decision was made to change code status to DNR and initiate comfort measures for terminal extubation.

## 2013-08-12 NOTE — Progress Notes (Signed)
Brief Update:  Spoke with pt's family at bedside this AM along with Dr. Conchita Paris regarding pt's status and poor prognosis.  Family has made the decision to change code status to DNR effective immediately and initiate comfort care measures.  Family has been with pt since last night and have made it clear that they do not want him to suffer any longer.  They have asked that we begin comfort care now.  All family questions answered.  Spoke with rep for ICD, they will be coming in to turn off pacer.  Orders are in, code status changed.  CCM will sign off.  Rutherford Guys, PA - S  CC time 35 min.  Patient seen and examined, agree with above note.  I dictated the care and orders written for this patient under my direction.  Alyson Reedy, MD [phone]

## 2013-08-12 NOTE — Clinical Social Work Note (Signed)
Clinical Social Worker received referral for comfort care support for family who is withdrawing support from patient.  CSW has spoken with RN and Bartolo Darter regarding patient family.  Patient family has already built rapport/relationship with Candelaria Stagers who will continue to follow patient family through withdraw process.  RN to notify CSW if further needs arise following patient death.  CSW remains available for outside support to family and staff as needed.  Macario Golds, [place] [phone]

## 2013-08-12 NOTE — Progress Notes (Signed)
Pt was terminally extubated around 11:15 AM.  Comfort measures in place prior.   Morphine drip started. [employer] rep turned off ICD prior to extubation.  Chaplin met with family at bedside.

## 2013-08-12 DEATH — deceased

## 2013-08-19 ENCOUNTER — Telehealth (HOSPITAL_COMMUNITY): Payer: Self-pay | Admitting: Emergency Medicine

## 2013-09-16 ENCOUNTER — Ambulatory Visit: Payer: Medicare Other | Admitting: Internal Medicine

## 2014-07-15 NOTE — Telephone Encounter (Signed)
This encounter is close, effective 07/15/14
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
